# Patient Record
Sex: Male | Born: 1980
Health system: Southern US, Community
[De-identification: ages and names within clinical notes are randomized; demographics above are authoritative.]

## PROBLEM LIST (undated history)

## (undated) DIAGNOSIS — R7303 Prediabetes: Secondary | ICD-10-CM

## (undated) DIAGNOSIS — U071 COVID-19: Secondary | ICD-10-CM

## (undated) DIAGNOSIS — F329 Major depressive disorder, single episode, unspecified: Secondary | ICD-10-CM

## (undated) DIAGNOSIS — Z87442 Personal history of urinary calculi: Secondary | ICD-10-CM

## (undated) DIAGNOSIS — F32A Depression, unspecified: Secondary | ICD-10-CM

## (undated) DIAGNOSIS — G4730 Sleep apnea, unspecified: Secondary | ICD-10-CM

## (undated) HISTORY — DX: Major depressive disorder, single episode, unspecified: F32.9

## (undated) HISTORY — DX: Depression, unspecified: F32.A

## (undated) HISTORY — PX: FOOT SURGERY: SHX648

## (undated) HISTORY — DX: Personal history of urinary calculi: Z87.442

## (undated) HISTORY — PX: OTHER SURGICAL HISTORY: SHX169

## (undated) HISTORY — PX: CHOLECYSTECTOMY: SHX55

---

## 2009-01-07 ENCOUNTER — Ambulatory Visit: Payer: Self-pay | Admitting: Family Medicine

## 2009-01-07 DIAGNOSIS — F329 Major depressive disorder, single episode, unspecified: Secondary | ICD-10-CM | POA: Insufficient documentation

## 2009-01-08 DIAGNOSIS — Z87442 Personal history of urinary calculi: Secondary | ICD-10-CM | POA: Insufficient documentation

## 2009-02-05 ENCOUNTER — Ambulatory Visit: Payer: Self-pay | Admitting: Family Medicine

## 2009-03-10 ENCOUNTER — Ambulatory Visit: Payer: Self-pay | Admitting: Family Medicine

## 2009-03-10 DIAGNOSIS — B369 Superficial mycosis, unspecified: Secondary | ICD-10-CM | POA: Insufficient documentation

## 2009-03-23 ENCOUNTER — Ambulatory Visit: Payer: Self-pay | Admitting: Family Medicine

## 2009-03-25 ENCOUNTER — Ambulatory Visit: Payer: Self-pay | Admitting: Family Medicine

## 2009-03-25 ENCOUNTER — Telehealth: Payer: Self-pay | Admitting: Family Medicine

## 2009-05-25 ENCOUNTER — Ambulatory Visit: Payer: Self-pay | Admitting: Family Medicine

## 2009-06-29 ENCOUNTER — Ambulatory Visit: Payer: Self-pay | Admitting: Family Medicine

## 2009-07-02 ENCOUNTER — Telehealth: Payer: Self-pay | Admitting: Family Medicine

## 2009-07-14 ENCOUNTER — Telehealth: Payer: Self-pay | Admitting: Family Medicine

## 2009-07-30 ENCOUNTER — Ambulatory Visit: Payer: Self-pay | Admitting: Family Medicine

## 2009-07-30 DIAGNOSIS — J111 Influenza due to unidentified influenza virus with other respiratory manifestations: Secondary | ICD-10-CM | POA: Insufficient documentation

## 2011-04-04 ENCOUNTER — Inpatient Hospital Stay (INDEPENDENT_AMBULATORY_CARE_PROVIDER_SITE_OTHER)
Admission: RE | Admit: 2011-04-04 | Discharge: 2011-04-04 | Disposition: A | Discharge status: Home / Self Care | Payer: Managed Care, Other (non HMO) | Source: Ambulatory Visit | Attending: Emergency Medicine | Admitting: Emergency Medicine

## 2011-04-04 ENCOUNTER — Observation Stay (HOSPITAL_COMMUNITY)
Admission: EM | Admit: 2011-04-04 | Discharge: 2011-04-05 | Disposition: A | Discharge status: Home / Self Care | Payer: Managed Care, Other (non HMO) | Source: Ambulatory Visit | Attending: General Surgery | Admitting: General Surgery

## 2011-04-04 DIAGNOSIS — K37 Unspecified appendicitis: Secondary | ICD-10-CM

## 2011-04-04 DIAGNOSIS — I10 Essential (primary) hypertension: Secondary | ICD-10-CM | POA: Insufficient documentation

## 2011-04-04 DIAGNOSIS — K3580 Unspecified acute appendicitis: Principal | ICD-10-CM | POA: Insufficient documentation

## 2011-04-04 DIAGNOSIS — Z01812 Encounter for preprocedural laboratory examination: Secondary | ICD-10-CM | POA: Insufficient documentation

## 2011-04-04 LAB — COMPREHENSIVE METABOLIC PANEL
AST: 12 U/L (ref 0–37)
BUN: 12 mg/dL (ref 6–23)
CO2: 27 mEq/L (ref 19–32)
Calcium: 9.6 mg/dL (ref 8.4–10.5)
Chloride: 101 mEq/L (ref 96–112)
Creatinine, Ser: 0.85 mg/dL (ref 0.50–1.35)
GFR calc Af Amer: >60 mL/min (ref >60)
GFR calc non Af Amer: >60 mL/min (ref >60)
Glucose, Bld: 99 mg/dL (ref 70–99)
Total Bilirubin: 0.5 mg/dL (ref 0.3–1.2)

## 2011-04-04 LAB — DIFFERENTIAL
Eosinophils Absolute: 0.1 10*3/uL (ref 0.0–0.7)
Lymphocytes Relative: 23 % (ref 12–46)
Lymphs Abs: 2.4 10*3/uL (ref 0.7–4.0)
Monocytes Relative: 11 % (ref 3–12)
Neutrophils Relative %: 65 % (ref 43–77)

## 2011-04-04 LAB — CBC
HCT: 41.5 % (ref 39.0–52.0)
MCH: 30.6 pg (ref 26.0–34.0)
MCV: 90.8 fL (ref 78.0–100.0)
Platelets: 272 10*3/uL (ref 150–400)
RBC: 4.57 MIL/uL (ref 4.22–5.81)
WBC: 10.3 10*3/uL (ref 4.0–10.5)

## 2011-04-04 LAB — POCT URINALYSIS DIP (DEVICE)
Bilirubin Urine: NEGATIVE
Ketones, ur: NEGATIVE mg/dL
Protein, ur: NEGATIVE mg/dL
Specific Gravity, Urine: 1.025 (ref 1.005–1.030)
pH: 7 (ref 5.0–8.0)

## 2011-04-05 ENCOUNTER — Other Ambulatory Visit (INDEPENDENT_AMBULATORY_CARE_PROVIDER_SITE_OTHER): Payer: Self-pay | Admitting: Surgery

## 2011-04-05 ENCOUNTER — Emergency Department (HOSPITAL_COMMUNITY): Payer: Managed Care, Other (non HMO)

## 2011-04-05 DIAGNOSIS — K3580 Unspecified acute appendicitis: Secondary | ICD-10-CM

## 2011-04-05 DIAGNOSIS — K3532 Acute appendicitis with perforation and localized peritonitis, without abscess: Secondary | ICD-10-CM | POA: Insufficient documentation

## 2011-04-05 MED ORDER — IOHEXOL 300 MG/ML  SOLN
100.0000 mL | Freq: Once | INTRAMUSCULAR | Status: AC | PRN
  Administered 2011-04-05: 100 mL via INTRAVENOUS

## 2011-04-10 NOTE — Op Note (Signed)
  NAME:  AMRON, GUERRETTE NO.:  [id number]  MEDICAL RECORD NO.:  [id number]  LOCATION:  [facility]                         FACILITY:  [facility]  PHYSICIAN:  Abigail Miyamoto, M.D. DATE OF BIRTH:  [birth year]  DATE OF PROCEDURE:  04/04/2011 DATE OF DISCHARGE:                              OPERATIVE REPORT   PREOPERATIVE DIAGNOSIS:  Acute appendicitis.  POSTOPERATIVE DIAGNOSIS:  Acute appendicitis.  PROCEDURE:  Laparoscopic appendectomy.  SURGEON:  Abigail Miyamoto, MD  ANESTHESIA:  General and 0.5% Marcaine.  ESTIMATED BLOOD LOSS:  Minimal.  FINDINGS:  The patient is found to have acute nonperforated appendicitis.  PROCEDURE IN DETAIL:  The patient was brought to the operating room, identified as Mabeline Caras.  He was placed spine on the operating table and general anesthesia was induced.  His abdomen was then prepped and draped in usual sterile fashion.  Using #15 blade, a small vertical incision was made below the umbilicus.  This was carried down the fascia which then opened with scalpel.  Hemostat was then used to pass into peritoneal cavity under direct vision.  A 0 Vicryl pursestring suture was then placed around the fascial opening.  The Hasson port was placed through the opening and insufflation of the abdomen was begun.  A 5-mm port was then placed in the patient's right upper quadrant and another in left upper quadrant, both under direct vision.  The appendix was then identified and found to be distended and acutely inflamed.  I took down the mesoappendix with the Harmonic scalpel.  This allowed me to identify the base of the appendix.  I then transected the base of the appendix with the endoscopic GIA stapler.  Once the appendix was transected, it was placed in an Endosac and removed through the incision at the umbilicus.  I again evaluated the mesoappendix and appendiceal stump, and hemostasis appeared to be achieved.  I then thoroughly irrigated  the abdomen with 1 liter normal saline.  There was no evidence of perforation of the appendix.  At this point, again hemostasis appeared to be achieved.  All ports were then removed under direct vision and the abdomen was deflated.  The 0 Vicryl at the umbilicus was tied in place closing the fascial defect.  All incisions were then anesthetized with Marcaine and closed with 4-0 Monocryl subcuticular sutures.  Steri-Strips and Band-Aids were then applied.  The patient tolerated the procedure well.  All counts were correct at the end of the procedure.  The patient was then extubated in the operating room and taken in stable condition to recovery room.     Abigail Miyamoto, M.D.     DB/MEDQ  D:  04/05/2011  T:  04/05/2011  Job:  [id number]  Electronically Signed by Abigail Miyamoto M.D. on 04/10/2011 02:04:44 PM

## 2011-04-10 NOTE — H&P (Signed)
NAME:  Jonathan Harper, Jonathan Harper NO.:  [id number]  MEDICAL RECORD NO.:  [id number]  LOCATION:  [facility]                         FACILITY:  [facility]  PHYSICIAN:  Abigail Miyamoto, M.D. DATE OF BIRTH:  [birth year]  DATE OF ADMISSION:  04/04/2011 DATE OF DISCHARGE:                             HISTORY & PHYSICAL   CHIEF COMPLAINT:  Right lower quadrant abdominal pain.  HISTORY:  This is a 30 year old gentleman who presents with a 24-hour history of right lower quadrant abdominal pain.  He reports it started in the right lower quadrant.  He describes as sharp and moderate in nature.  He does not refer anywhere else.  It is continuous.  He has had some slight nausea with this, but no vomiting.  He is otherwise without complaints.  PAST MEDICAL HISTORY:  Borderline hypertension.  PAST SURGICAL HISTORY:  Removal of bone spur from his foot.  MEDICATIONS:  None.  ALLERGIES:  No known drug allergies.  FAMILY HISTORY:  Positive for hypertension.  SOCIAL HISTORY:  He does drink alcohol.  He denies smoking.  REVIEW OF SYSTEMS:  GENERAL:  Negative fever or chills.  PULMONARY: Negative for cough, shortness of breath, or difficulty breathing. CARDIAC:  Negative for chest pain or irregular heartbeat.  ABDOMEN: Listed as above.  There has been no emesis.  Bowel movements are normal. URINARY:  Negative for dysuria or hematuria.  The rest review of systems skin, eyes, ears, nose, throat, musculoskeletal, neurologic, psychiatric, endocrine are normal.  PHYSICAL EXAMINATION:  GENERAL:  This is a large gentleman in no acute distress. VITAL SIGNS:  Temperature 98.6, pulse 70, respiratory rate 18, blood pressure 125/78. EYES:  Anicteric.  Pupils reactive bilaterally. ENT:  External ears, nose are normal.  Hearing is normal.  Oropharynx clear. NECK: Supple.  Trachea is midline.  There is no thyromegaly. LUNGS:  Clear to auscultation bilaterally with normal  respiratory effort. CARDIOVASCULAR:  Regular rate and rhythm.  There are no murmurs.  There is no peripheral edema. ABDOMEN:  Soft.  There is tenderness with guarding in the right lower quadrant.  There are no hernias.  There are no masses. EXTREMITIES:  Warm and well-perfused.  No edema, clubbing, cyanosis. MUSCULOSKELETAL:  Normal throughout with normal strength and motor function in all four extremities. SKIN:  No rash, no jaundice. NEUROLOGIC:  Shows him to be awake, alert, and oriented.  Motor and sensory function are grossly intact in all four extremities. PSYCHIATRIC:  Shows him to have normal judgment and affect.  Again, he is awake, alert, and oriented x3.  DATA REVIEW:  The patient has a white blood count of 10.3, hemoglobin of 14.0, platelets of 278, creatinine 0.85, potassium 3.6.  The patient's CAT scan of abdomen and pelvis shows him to have dilated appendix with periappendiceal stranding.  IMPRESSION:  This is a 30 year old gentleman with acute appendicitis.  PLAN:  At this point, we will proceed to the operating room for an appendectomy.  I have discussed the laparoscopic technique with the patient and his family.  I discussed the risk of surgery, which include but not limited to bleeding, infection, injury to surrounding structures including ureters, appendiceal stump leak, abscess formation, etc.  They understand the risk and wish to proceed.  Surgery will thus be scheduled urgently.     Abigail Miyamoto, M.D.     DB/MEDQ  D:  04/05/2011  T:  04/05/2011  Job:  [id number]  Electronically Signed by Abigail Miyamoto M.D. on 04/10/2011 02:04:41 PM

## 2011-04-11 ENCOUNTER — Encounter (INDEPENDENT_AMBULATORY_CARE_PROVIDER_SITE_OTHER): Payer: Self-pay

## 2011-04-26 ENCOUNTER — Encounter (INDEPENDENT_AMBULATORY_CARE_PROVIDER_SITE_OTHER): Payer: Self-pay

## 2011-04-26 ENCOUNTER — Ambulatory Visit (INDEPENDENT_AMBULATORY_CARE_PROVIDER_SITE_OTHER): Payer: Managed Care, Other (non HMO) | Admitting: Radiology

## 2011-04-26 DIAGNOSIS — K352 Acute appendicitis with generalized peritonitis, without abscess: Secondary | ICD-10-CM

## 2011-04-26 DIAGNOSIS — K3532 Acute appendicitis with perforation and localized peritonitis, without abscess: Secondary | ICD-10-CM

## 2011-04-26 NOTE — Progress Notes (Signed)
History of Present Illness: Jonathan Harper is a  30 y.o. male who presents today status post lap appy.  Pathology reveals appendicitis with focal perforation, no malignancy.  The patient is tolerating a regular diet, having normal bowel movements, has good pain control.  He  is back to most normal activities. He has returned to work with some restrictions already.  Physical Exam: Abd: soft, nontender, active bowel sounds, nondistended.  All incisions are well healed.  Impression: 1.  Acute appendicitis, s/p lap appy  Plan: He is able to return to normal activities. He may follow up on a prn basis.

## 2011-07-18 ENCOUNTER — Ambulatory Visit (INDEPENDENT_AMBULATORY_CARE_PROVIDER_SITE_OTHER): Payer: Managed Care, Other (non HMO) | Admitting: Family Medicine

## 2011-07-18 ENCOUNTER — Encounter: Payer: Self-pay | Admitting: Family Medicine

## 2011-07-18 VITALS — BP 120/84 | HR 74 | Temp 97.4°F | Ht 73.0 in | Wt 267.8 lb

## 2011-07-18 DIAGNOSIS — S0501XA Injury of conjunctiva and corneal abrasion without foreign body, right eye, initial encounter: Secondary | ICD-10-CM

## 2011-07-18 DIAGNOSIS — S058X9A Other injuries of unspecified eye and orbit, initial encounter: Secondary | ICD-10-CM

## 2011-07-18 MED ORDER — ERYTHROMYCIN 5 MG/GM OP OINT: TOPICAL_OINTMENT | Freq: Four times a day (QID) | OPHTHALMIC | Status: AC

## 2011-07-18 NOTE — Progress Notes (Signed)
  Subjective:    Patient ID: Jonathan Harper, male    DOB: 09-Jun-1981, 30 y.o.   MRN: [id number]  HPI  Pleasant healthy gentleman, 30 years old, who presents with a two-day history of right back pain and redness. His sclera is white, and reddish in appearance, and he also had some heavy matting this morning. The only history of potential trauma is over the weekend, building a fire and chopping some wood. He does not recall any specific incidents of trauma to his eye.  The PMH, PSH, Social History, Family History, Medications, and allergies have been reviewed in [facility], and have been updated if relevant.   Review of Systems ROS: GEN: Acute illness details above GI: Tolerating PO intake GU: maintaining adequate hydration and urination Pulm: No SOB Interactive and getting along well at home.  Otherwise, ROS is as per the HPI. k    Objective:   Physical Exam   Physical Exam  Blood pressure 120/84, pulse 74, temperature 97.4 F (36.3 C), temperature source Oral, height 6\' 1"  (1.854 m), weight 267 lb 12.8 oz (121.473 kg), SpO2 98.00%.  GEN: WDWN, NAD, Non-toxic, A & O x 3 HEENT: Atraumatic, Normocephalic. Neck supple. No masses, No LAD. EOMI. examined using high kit and fluorescein dye. The right eye, on the right upper lateral aspect of the cornea into the sclera, there appeared to be a defect that had some uptake of fluorescein dye. Ears and Nose: No external deformity. EXTR: No c/c/e NEURO Normal gait.  PSYCH: Normally interactive. Conversant. Not depressed or anxious appearing.  Calm demeanor.         Assessment & Plan:   1. Corneal abrasion, right  erythromycin ophthalmic ointment    To me this has the appearance on fluoroscopic evaluation of a small corneal abrasion. I will keep him using erythromycin ointment 4 times daily, and recheck his eye on Thursday

## 2011-07-18 NOTE — Patient Instructions (Signed)
Recheck with Dr. Patsy Lager on Thursday morning

## 2011-07-21 ENCOUNTER — Encounter: Payer: Self-pay | Admitting: Family Medicine

## 2011-07-21 ENCOUNTER — Ambulatory Visit (INDEPENDENT_AMBULATORY_CARE_PROVIDER_SITE_OTHER): Payer: Managed Care, Other (non HMO) | Admitting: Family Medicine

## 2011-07-21 VITALS — BP 120/78 | HR 66 | Temp 98.6°F | Ht 73.0 in | Wt 268.0 lb

## 2011-07-21 DIAGNOSIS — S058X9A Other injuries of unspecified eye and orbit, initial encounter: Secondary | ICD-10-CM

## 2011-07-21 DIAGNOSIS — S0501XA Injury of conjunctiva and corneal abrasion without foreign body, right eye, initial encounter: Secondary | ICD-10-CM

## 2011-07-21 NOTE — Progress Notes (Signed)
  Subjective:    Patient ID: Jonathan Harper, male    DOB: [birth year], 30 y.o.   MRN: [id number]  HPI  F/u corneal abrasion on the R. Pain decreasing. Eye improving. Sight improving. Using erythro ointment   The PMH, PSH, Social History, Family History, Medications, and allergies have been reviewed in [facility], and have been updated if relevant.   Review of Systems No fever or chills    Objective:   Physical Exam   Physical Exam  Blood pressure 120/78, pulse 66, temperature 98.6 F (37 C), temperature source Oral, height 6\' 1"  (1.854 m), weight 268 lb (121.564 kg), SpO2 98.00%.  GEN: WDWN, NAD, Non-toxic, A & O x 3 HEENT: Atraumatic, Normocephalic. Neck supple. No masses, No LAD. PERRLA. EOMI Ears and Nose: No external deformity. EXTR: No c/c/e NEURO Normal gait.  PSYCH: Normally interactive. Conversant. Not depressed or anxious appearing.  Calm demeanor.        Assessment & Plan:   Corneal abrasion, improving, cont ABX ointment

## 2011-11-21 ENCOUNTER — Ambulatory Visit (INDEPENDENT_AMBULATORY_CARE_PROVIDER_SITE_OTHER)
Admission: RE | Admit: 2011-11-21 | Discharge: 2011-11-21 | Disposition: A | Discharge status: Home / Self Care | Payer: Managed Care, Other (non HMO) | Source: Ambulatory Visit | Attending: Family Medicine | Admitting: Family Medicine

## 2011-11-21 ENCOUNTER — Encounter: Payer: Self-pay | Admitting: Family Medicine

## 2011-11-21 ENCOUNTER — Ambulatory Visit (INDEPENDENT_AMBULATORY_CARE_PROVIDER_SITE_OTHER): Payer: Managed Care, Other (non HMO) | Admitting: Family Medicine

## 2011-11-21 VITALS — BP 120/82 | HR 79 | Temp 98.6°F | Ht 73.0 in | Wt 271.0 lb

## 2011-11-21 DIAGNOSIS — M25569 Pain in unspecified knee: Secondary | ICD-10-CM

## 2011-11-21 DIAGNOSIS — M2391 Unspecified internal derangement of right knee: Secondary | ICD-10-CM

## 2011-11-21 DIAGNOSIS — M25561 Pain in right knee: Secondary | ICD-10-CM

## 2011-11-21 DIAGNOSIS — Z3009 Encounter for other general counseling and advice on contraception: Secondary | ICD-10-CM

## 2011-11-21 DIAGNOSIS — M2390 Unspecified internal derangement of unspecified knee: Secondary | ICD-10-CM

## 2011-11-21 MED ORDER — DICLOFENAC SODIUM 75 MG PO TBEC: 75.0000 mg | DELAYED_RELEASE_TABLET | Freq: Two times a day (BID) | ORAL | Status: AC

## 2011-11-21 NOTE — Progress Notes (Signed)
  Patient Name: Jonathan Harper Date of Birth: [birth year] Age: 31 y.o. Medical Record Number: [id number] Gender: male Date of Encounter: 11/21/2011  History of Present Illness:  Jonathan Harper is a 31 y.o. very pleasant male patient who presents with the following:  R knee, no fall, no bumping it or anything in any way. Hurts when squatting -- diffusely anterior. Has had some occ soreness in knees.   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  Patient presents with 30 day h/o R sided knee pain after no acute injury. No audible pop was heard. The patient has not had an effusion. No symptomatic giving-way. No mechanical clicking. Joint has not locked up. Patient has been able to walk but is limping. The patient does have pain going up and down stairs or rising from a seated position. Pain with deep flexion.  Pain location: anterior and interior Current physical activity: active job Prior Knee Surgery: none Current pain meds: none    Past Medical History, Surgical History, Social History, Family History, Problem List, Medications, and Allergies have been reviewed and updated if relevant.  Review of Systems:  GEN: No fevers, chills. Nontoxic. Primarily MSK c/o today. MSK: Detailed in the HPI GI: tolerating PO intake without difficulty Neuro: No numbness, parasthesias, or tingling associated. Otherwise the pertinent positives of the ROS are noted above.    Physical Examination: Filed Vitals:   11/21/11 0816  BP: 120/82  Pulse: 79  Temp: 98.6 F (37 C)  TempSrc: Oral  Height: 6\' 1"  (1.854 m)  Weight: 271 lb (122.925 kg)  SpO2: 98%    Body mass index is 35.75 kg/(m^2).   GEN: WDWN, NAD, Non-toxic, Alert & Oriented x 3 HEENT: Atraumatic, Normocephalic.  Ears and Nose: No external deformity. EXTR: No clubbing/cyanosis/edema NEURO: Normal gait.  PSYCH: Normally interactive. Conversant. Not depressed or anxious appearing.  Calm demeanor.   Knee:  r Gait: Normal heel toe pattern ROM: loss of  3 deg ext and flexion to 125 Effusion: neg Echymosis or edema: none Patellar tendon NT Painful PLICA: neg Patellar grind: negative Medial and lateral patellar facet loading: negative medial and lateral joint lines:NT Mcmurray's pain Flexion-pinch pos Flexion pinch, pos Varus and valgus stress: stable Lachman: neg Ant and Post drawer: neg Hip abduction, IR, ER: WNL Hip flexion str: 5/5 Hip abd: 5/5 Quad: 5/5 VMO atrophy:No Hamstring concentric and eccentric: 5/5   Assessment and Plan: 1. Internal derangement of right knee    2. Right knee pain  DG Knee Bilateral Standing AP, DG Knee 1-2 Views Right  3. Birth control counseling  Ambulatory referral to Urology     Meds ordered this encounter  Medications  . diclofenac (VOLTAREN) 75 MG EC tablet    Sig: Take 1 tablet (75 mg total) by mouth 2 (two) times daily.    Dispense:  60 tablet    Refill:  3   Ref for vasectomy  Synovitis vs potential meniscal injury Oral nsaids If not improving in 3-4 weeks, will f/u with me

## 2011-12-13 ENCOUNTER — Encounter: Payer: Self-pay | Admitting: Family Medicine

## 2011-12-13 ENCOUNTER — Ambulatory Visit (INDEPENDENT_AMBULATORY_CARE_PROVIDER_SITE_OTHER): Payer: Managed Care, Other (non HMO) | Admitting: Family Medicine

## 2011-12-13 VITALS — BP 138/90 | HR 72 | Temp 97.9°F | Wt 271.0 lb

## 2011-12-13 DIAGNOSIS — J069 Acute upper respiratory infection, unspecified: Secondary | ICD-10-CM

## 2011-12-13 NOTE — Patient Instructions (Signed)
This is likely a virus.  Drink lots of fluids.  Treat sympotmatically with Mucinex, nasal saline irrigation, and Tylenol/Ibuprofen.  You can use warm compresses.  Cough suppressant at night. Call if not improving as expected in 5-7 days.

## 2011-12-13 NOTE — Progress Notes (Signed)
SUBJECTIVE:  Jonathan Harper is a 31 y.o. male who complains of coryza, congestion, sneezing and sore throat for 3 days. He denies a history of anorexia, chest pain and chills and denies a history of asthma. Patient denies smoke cigarettes.   Patient Active Problem List  Diagnoses  . DEPRESSION  . NEPHROLITHIASIS, HX OF  . URI (upper respiratory infection)   Past Medical History  Diagnosis Date  . Depression   . History of nephrolithiasis    Past Surgical History  Procedure Date  . Cholecystectomy   . Foot surgery   . Heel spur removal    History  Substance Use Topics  . Smoking status: Current Some Day Smoker    Types: Cigars  . Smokeless tobacco: Not on file   Comment: May smoke a cigar every month or two  . Alcohol Use: Yes   Family History  Problem Relation Age of Onset  . Hypertension    . Cancer    . Arthritis Mother   . Arthritis Father    No Known Allergies Current Outpatient Prescriptions on File Prior to Visit  Medication Sig Dispense Refill  . diclofenac (VOLTAREN) 75 MG EC tablet Take 1 tablet (75 mg total) by mouth 2 (two) times daily.  60 tablet  3  . famotidine (PEPCID) 10 MG tablet Take 10 mg by mouth every morning.         The PMH, PSH, Social History, Family History, Medications, and allergies have been reviewed in [facility] and have been updated if relevant.  OBJECTIVE: BP 138/90  Pulse 72  Temp(Src) 97.9 F (36.6 C) (Oral)  Wt 271 lb (122.925 kg)  He appears well, vital signs are as noted. Ears normal.  Throat and pharynx normal.  Neck supple. No adenopathy in the neck. Nose is congested. Sinuses non tender. The chest is clear, without wheezes or rales.  ASSESSMENT:  viral upper respiratory illness  PLAN: Symptomatic therapy suggested: push fluids, rest and return office visit prn if symptoms persist or worsen. Lack of antibiotic effectiveness discussed with him. Call or return to clinic prn if these symptoms worsen or fail to improve as  anticipated.

## 2011-12-28 ENCOUNTER — Telehealth: Payer: Self-pay | Admitting: *Deleted

## 2011-12-28 NOTE — Telephone Encounter (Signed)
Pt brought in FMLA forms for being out of work with URI.  I filled this out the best that I could, please review, form is on your desk.  He says he was out of work 12/12/11-12/14/11.

## 2011-12-29 NOTE — Telephone Encounter (Signed)
Thank you. Completed and in my box. 

## 2011-12-29 NOTE — Telephone Encounter (Signed)
Forms faxed to [phone], pt's employer.

## 2013-06-17 ENCOUNTER — Ambulatory Visit (INDEPENDENT_AMBULATORY_CARE_PROVIDER_SITE_OTHER): Payer: Managed Care, Other (non HMO) | Admitting: Family Medicine

## 2013-06-17 ENCOUNTER — Encounter: Payer: Self-pay | Admitting: Family Medicine

## 2013-06-17 VITALS — BP 124/86 | HR 72 | Temp 98.3°F | Wt 267.5 lb

## 2013-06-17 DIAGNOSIS — B353 Tinea pedis: Secondary | ICD-10-CM

## 2013-06-17 MED ORDER — CEPHALEXIN 500 MG PO CAPS: 500.0000 mg | ORAL_CAPSULE | Freq: Four times a day (QID) | ORAL | Status: DC

## 2013-06-17 NOTE — Assessment & Plan Note (Signed)
He may have had a transient infection with skin disruption from tinea/irritation.  Hold abx for now, treat likely fungal infection.  D/w pt about keeping foot dry.  F/u prn.  He agrees.

## 2013-06-17 NOTE — Patient Instructions (Addendum)
Keep your foot elevated today.  Keep your feet dry and change socks often. Use the antibiotics if you get redness.  Use an OTC athlete's foot cream.   Take care.

## 2013-06-17 NOTE — Progress Notes (Signed)
L foot concern, started in the last few days. He was worried about an infection.  Tender at the 1st and 2nd web space.  Less tender today.  Some puffiness and sore on dorsal distal midfoot.  No h/o gout.  H/o dry cracking skin in the area.  No FCNAVD.  No trauma.  Less sore today.  Steel toe at work.  Heavy foot sweat.    Meds, vitals, and allergies reviewed.   ROS: See HPI.  Otherwise, noncontributory.  nad L foot with dry flaking skin in the web spaces Normal DP pulse, no erythema.  Mildly puffy on the dorsum of the distal midfoot No fluctuant mass or red cellulitis.

## 2013-09-16 ENCOUNTER — Ambulatory Visit (INDEPENDENT_AMBULATORY_CARE_PROVIDER_SITE_OTHER): Payer: Managed Care, Other (non HMO) | Admitting: Family Medicine

## 2013-09-16 ENCOUNTER — Encounter: Payer: Self-pay | Admitting: Family Medicine

## 2013-09-16 VITALS — BP 144/94 | HR 93 | Temp 98.2°F | Wt 279.0 lb

## 2013-09-16 DIAGNOSIS — M5432 Sciatica, left side: Secondary | ICD-10-CM

## 2013-09-16 DIAGNOSIS — M5430 Sciatica, unspecified side: Secondary | ICD-10-CM | POA: Insufficient documentation

## 2013-09-16 MED ORDER — HYDROCODONE-ACETAMINOPHEN 5-325 MG PO TABS: 1.0000 | ORAL_TABLET | Freq: Four times a day (QID) | ORAL | Status: DC | PRN

## 2013-09-16 MED ORDER — CYCLOBENZAPRINE HCL 10 MG PO TABS: 10.0000 mg | ORAL_TABLET | Freq: Three times a day (TID) | ORAL | Status: DC | PRN

## 2013-09-16 MED ORDER — IBUPROFEN 200 MG PO CAPS: 600.0000 mg | ORAL_CAPSULE | Freq: Three times a day (TID) | ORAL | Status: DC | PRN

## 2013-09-16 NOTE — Progress Notes (Signed)
Pre-visit discussion using our clinic review tool. No additional management support is needed unless otherwise documented below in the visit note.  Friday at work he was shoveling. Felt tighter in his back as the day went on.  Trouble getting out of bed on Saturday.  L lower back pain, tight.  Pain with leaning over, certain movements. No R sided pain.  No rash.  L radicular pain, posterior, "like an ice pick."  No FCNAVD.  No dysuria.  Sensation still wnl for the BLE.  He has "pulled" his back prev but this didn't resolve as quickly as previous.    Meds, vitals, and allergies reviewed.   ROS: See HPI.  Otherwise, noncontributory.  nad ncat rrr ctab No midline back pain L lower back ttp, at the belt line, R lower back not ttp No CVA pain Pain with flex and ext of back, pain with twisting Distally nv intact with S/S wnl for the BLE  No rash Pain radiating down the posterior L leg

## 2013-09-16 NOTE — Assessment & Plan Note (Signed)
Anatomy d/w pt. Would use ibuprofen with GI caution, vicodin and flexeril with sedation caution.  Handout given re: home exercises.  We can refer to PT if needed.  No imaging needed. Call back prn.  He agrees.

## 2013-09-16 NOTE — Patient Instructions (Signed)
Ibuprofen with food, sedation caution on flexeril and hydrocodone.  Heating pad and ice as needed.  Use the stretches.  Take care.  We can refer you to PT if needed.

## 2013-10-21 ENCOUNTER — Encounter: Payer: Self-pay | Admitting: Family Medicine

## 2013-10-21 ENCOUNTER — Ambulatory Visit (INDEPENDENT_AMBULATORY_CARE_PROVIDER_SITE_OTHER): Payer: Managed Care, Other (non HMO) | Admitting: Family Medicine

## 2013-10-21 VITALS — BP 128/88 | HR 75 | Temp 98.1°F | Wt 277.8 lb

## 2013-10-21 DIAGNOSIS — J111 Influenza due to unidentified influenza virus with other respiratory manifestations: Secondary | ICD-10-CM

## 2013-10-21 NOTE — Progress Notes (Signed)
   Patient Name: Jonathan Harper Date of Birth: 1980-12-09 Medical Record Number: [id number] Gender: male  History of Present Illness:  Jonathan CarasClay Zhao presents with runny nose, sneezing, cough, sore throat, malaise, myalgias, arthralgia, chills, and fever. Since last Wednesday, had a bad pain in his throat and had some off and on fever. Some wheezy cough with a fever. Did have some achy and full body ache. Lost voice for a couple of days. Fever to about 101.   Taking nyquil and dayquil.   +recent exposure to others with similar symptoms.   The patent denies sore throat as the primary complaint. Denies sthortness of breath/wheezing, otalgia, facial pain, abdominal pain, changes in bowel or bladder.  Generally feels terrible  Tmax: 101  PMH, PHS, Allergies, Problem List, Medications, Family History, and Social History have all been reviewed.  Review of Systems: as above, eating and drinking - tolerating PO. Urinating normally. No excessive vomitting or diarrhea. O/w as above.  Physical Exam:  Filed Vitals:   10/21/13 1548  BP: 128/88  Pulse: 75  Temp: 98.1 F (36.7 C)  TempSrc: Oral  Weight: 277 lb 12 oz (125.987 kg)  SpO2: 96%    Gen: WDWN, NAD; A & O x3, cooperative. Pleasant.Globally Non-toxic HEENT: Normocephalic and atraumatic. Throat clear, w/o exudate, R TM clear, L TM - good landmarks, No fluid present. rhinnorhea. No frontal or maxillary sinus T. MMM NECK: Anterior cervical  LAD is absent CV: RRR, No M/G/R, cap refill <2 sec PULM: Breathing comfortably in no respiratory distress. no wheezing, crackles, rhonchi ABD: S,NT,ND,+BS. No HSM. No rebound. EXT: No c/c/e PSYCH: Friendly, good eye contact MSK: Nml gait   Assessment and Plan: 1. Influenza: The patient's clinical exam and history is consistent with a diagnosis of influenza. Resolving, cont supportive care. OK to return to work. AF now 48 hours  Supportive care. Fluids. Cough medicines as needed    Anti-pyretics.  Infection control emphasized, including OOW or school until AF 24 hours.  No orders of the defined types were placed in this encounter.    Patient Instructions: INFLUENZA Viral illness: High fever, headache, bad cough, body aches, sore throat, sometimes nausea, vomitting, diarrhea.  Usually quick onset  TREATMENT 1. Decongestant: Sudafed (NOT IF HIGH BLOOD PRESSURE) 2. Nose Sprays: Saline nasal spray, Can use Afrin or Neosynephrine, but no more than 3 days 3. Cough Suppressants: DM portion of cough med, or Strong prescription 4. Expectorant: Liquify Secretions (Guaifenesin) 5. Example: Mucinex (expectorant) or Mucinex-D (expectorant and decongestant) 6. Take all prescribed meds 7. Anti-virals may be used if caught early or in high risk people. (Do NOT if pregnant, breast feeding, seizure disorder) 8. Rest helpful, but move some during day 9. Breathe moist air: Humidifier, Vaporizer, or steam from shower 10. No work or school until no fever for 24 hrs WITH NO Tylenol or Ibuprofen. 11. Pneumonia on top of Flu is possible, if you are doing poorly particularly if a smoker or have COPD, please let us know. 12. Wash hands and cover mouth with cough   Signed,  Takoda Janowiak T. Mahdi Frye, MD, CAQ Sports Medicine  [facility] [address] [place] [address] Phone: [phone] Fax: [phone]

## 2013-10-21 NOTE — Progress Notes (Signed)
Pre-visit discussion using our clinic review tool. No additional management support is needed unless otherwise documented below in the visit note.  

## 2013-10-22 ENCOUNTER — Telehealth: Payer: Self-pay | Admitting: Family Medicine

## 2013-10-22 NOTE — Telephone Encounter (Signed)
Relevant patient education assigned to patient using Emmi. ° °

## 2013-11-01 ENCOUNTER — Encounter: Payer: Self-pay | Admitting: Family Medicine

## 2013-11-01 ENCOUNTER — Ambulatory Visit (INDEPENDENT_AMBULATORY_CARE_PROVIDER_SITE_OTHER): Payer: Managed Care, Other (non HMO) | Admitting: Family Medicine

## 2013-11-01 VITALS — BP 120/90 | HR 84 | Temp 98.7°F | Ht 73.0 in | Wt 282.8 lb

## 2013-11-01 DIAGNOSIS — J209 Acute bronchitis, unspecified: Secondary | ICD-10-CM

## 2013-11-01 DIAGNOSIS — B9689 Other specified bacterial agents as the cause of diseases classified elsewhere: Secondary | ICD-10-CM

## 2013-11-01 DIAGNOSIS — A499 Bacterial infection, unspecified: Secondary | ICD-10-CM

## 2013-11-01 DIAGNOSIS — J208 Acute bronchitis due to other specified organisms: Principal | ICD-10-CM

## 2013-11-01 MED ORDER — ALBUTEROL SULFATE HFA 108 (90 BASE) MCG/ACT IN AERS: 2.0000 | INHALATION_SPRAY | Freq: Four times a day (QID) | RESPIRATORY_TRACT | Status: DC | PRN

## 2013-11-01 MED ORDER — AZITHROMYCIN 250 MG PO TABS: ORAL_TABLET | ORAL | Status: DC

## 2013-11-01 NOTE — Progress Notes (Signed)
   Subjective:    Patient ID: Jonathan Harper, male    DOB: [birth year], 33 y.o.   MRN: [id number]  Cough This is a new problem. The current episode started 1 to 4 weeks ago (Started with virla illness in 2/9.Marland Kitchen. seen by Dr. Patsy Lageropland, got a little better, than symtpoms returned in lastr 3 days.). The problem has been gradually worsening. The cough is productive of purulent sputum (dark brown). Associated symptoms include chills, ear congestion, headaches, nasal congestion, rhinorrhea, shortness of breath and wheezing. Pertinent negatives include no ear pain, fever, myalgias, postnasal drip or rash. Associated symptoms comments: SOB with coughing fits.. Risk factors for lung disease include smoking/tobacco exposure. Treatments tried: nyquil and dayquil, cough drops. The treatment provided mild relief. There is no history of asthma, bronchiectasis, COPD, emphysema, environmental allergies or pneumonia.      Review of Systems  Constitutional: Positive for chills. Negative for fever.  HENT: Positive for rhinorrhea. Negative for ear pain and postnasal drip.   Respiratory: Positive for cough, shortness of breath and wheezing.   Musculoskeletal: Negative for myalgias.  Skin: Negative for rash.  Allergic/Immunologic: Negative for environmental allergies.  Neurological: Positive for headaches.       Objective:   Physical Exam  Constitutional: Vital signs are normal. He appears well-developed and well-nourished.  Non-toxic appearance. He does not appear ill. No distress.  HENT:  Head: Normocephalic and atraumatic.  Right Ear: Hearing, tympanic membrane, external ear and ear canal normal. No tenderness. No foreign bodies. Tympanic membrane is not retracted and not bulging.  Left Ear: Hearing, tympanic membrane, external ear and ear canal normal. No tenderness. No foreign bodies. Tympanic membrane is not retracted and not bulging.  Nose: Nose normal. No mucosal edema or rhinorrhea. Right sinus exhibits no  maxillary sinus tenderness and no frontal sinus tenderness. Left sinus exhibits no maxillary sinus tenderness and no frontal sinus tenderness.  Mouth/Throat: Uvula is midline, oropharynx is clear and moist and mucous membranes are normal. Normal dentition. No dental caries. No oropharyngeal exudate or tonsillar abscesses.  Eyes: Conjunctivae, EOM and lids are normal. Pupils are equal, round, and reactive to light. Lids are everted and swept, no foreign bodies found.  Neck: Trachea normal, normal range of motion and phonation normal. Neck supple. Carotid bruit is not present. No mass and no thyromegaly present.  Cardiovascular: Normal rate, regular rhythm, S1 normal, S2 normal, normal heart sounds, intact distal pulses and normal pulses.  Exam reveals no gallop.   No murmur heard. Pulmonary/Chest: Effort normal. No respiratory distress. He has no decreased breath sounds. He has wheezes in the right upper field, the right middle field, the right lower field, the left upper field, the left middle field and the left lower field. He has no rhonchi. He has no rales.  Abdominal: Soft. Normal appearance and bowel sounds are normal. There is no hepatosplenomegaly. There is no tenderness. There is no rebound, no guarding and no CVA tenderness. No hernia.  Neurological: He is alert. He has normal reflexes.  Skin: Skin is warm, dry and intact. No rash noted.  Psychiatric: He has a normal mood and affect. His speech is normal and behavior is normal. Judgment normal.          Assessment & Plan:

## 2013-11-01 NOTE — Patient Instructions (Signed)
Mucinex DM twice daily.. Or nyquil to dayquil. Start and complete the antibiotics. Albuterol for shortness of breath or wheeze, cough fits. Call if fever on antibiotics or SOB worsening, or not any better in 48-72 hours.

## 2013-11-01 NOTE — Progress Notes (Signed)
Pre visit review using our clinic review tool, if applicable. No additional management support is needed unless otherwise documented below in the visit note. 

## 2013-11-01 NOTE — Assessment & Plan Note (Signed)
Bacterial superinfection on top of viral infection. Treat with antibitoics. Albuterol for wheeze, if not improving consider CXR and pred taper.

## 2013-11-04 ENCOUNTER — Telehealth: Payer: Self-pay | Admitting: Family Medicine

## 2013-11-04 NOTE — Telephone Encounter (Signed)
Relevant patient education assigned to patient using Emmi. ° °

## 2014-11-12 ENCOUNTER — Ambulatory Visit (INDEPENDENT_AMBULATORY_CARE_PROVIDER_SITE_OTHER): Payer: BLUE CROSS/BLUE SHIELD | Admitting: Family Medicine

## 2014-11-12 ENCOUNTER — Encounter: Payer: Self-pay | Admitting: Internal Medicine

## 2014-11-12 DIAGNOSIS — J029 Acute pharyngitis, unspecified: Secondary | ICD-10-CM

## 2014-11-12 LAB — POCT RAPID STREP A (OFFICE): RAPID STREP A SCREEN: NEGATIVE

## 2014-11-12 NOTE — Progress Notes (Signed)
Mr. Mayford KnifeWilliams was here with his son Renae FickleCylas, who was seeing Dr. Patsy Lageropland.  Dr. Patsy Lageropland had me swab dad's throat which was negative. He was scheduled to see Nicki ReaperRegina Baity but left after son was seen.

## 2014-11-12 NOTE — Progress Notes (Signed)
   Dr. Karleen HampshireSpencer T. Evanny Ellerbe, MD, CAQ [facility] [address] [place], [id number] Phone: [phone] Fax: [phone]  11/12/2014  Patient: Jonathan CarasClay Cuadros, MRN: [id number], DOB: 10/31/1980, 34 y.o.  Primary Physician:  Hannah BeatSpencer Sloane Junkin, MD  Chief Complaint: Sore Throat and Cough   I saw the patient, and he was doing fairly well, did have some sore throat. We did a rapid strep test, and this was negative. At the time I was evaluating the patient sign. He was nontoxic. Throat was minimally red.  Electronically Signed  By: Hannah BeatSpencer Genita Nilsson, MD On: 11/12/2014 5:06 PM   Results for orders placed or performed in visit on 11/12/14  POCT rapid strep A  Result Value Ref Range   Rapid Strep A Screen Negative Negative

## 2014-11-12 NOTE — Progress Notes (Signed)
Pre visit review using our clinic review tool, if applicable. No additional management support is needed unless otherwise documented below in the visit note. 

## 2014-11-14 ENCOUNTER — Ambulatory Visit: Payer: Managed Care, Other (non HMO) | Admitting: Internal Medicine

## 2016-01-11 ENCOUNTER — Ambulatory Visit (INDEPENDENT_AMBULATORY_CARE_PROVIDER_SITE_OTHER): Payer: BLUE CROSS/BLUE SHIELD | Admitting: Internal Medicine

## 2016-01-11 ENCOUNTER — Encounter: Payer: Self-pay | Admitting: Internal Medicine

## 2016-01-11 VITALS — BP 142/90 | HR 81 | Temp 98.2°F | Wt 298.0 lb

## 2016-01-11 DIAGNOSIS — J069 Acute upper respiratory infection, unspecified: Secondary | ICD-10-CM

## 2016-01-11 DIAGNOSIS — J309 Allergic rhinitis, unspecified: Secondary | ICD-10-CM | POA: Diagnosis not present

## 2016-01-11 MED ORDER — HYDROCODONE-HOMATROPINE 5-1.5 MG/5ML PO SYRP: 5.0000 mL | ORAL_SOLUTION | Freq: Three times a day (TID) | ORAL | Status: DC | PRN

## 2016-01-11 NOTE — Patient Instructions (Signed)

## 2016-01-11 NOTE — Progress Notes (Signed)
Pre visit review using our clinic review tool, if applicable. No additional management support is needed unless otherwise documented below in the visit note. 

## 2016-01-11 NOTE — Progress Notes (Signed)
HPI  Pt presents to the clinic today with c/o runny nose, sore throat and cough. This started 5 days ago. He is blowing clear mucous out of his nose, very rarely. He has had some difficulty swallowing.The cough is productive or yellow/brown mucous, usually in the morning. The cough is worse at night. He denies fever, chills, body aches or shortness of breath. He has tried Dayquil, Nyquil with some relief. He has no history of allergies or asthma. He has had sick contacts.  Review of Systems      Past Medical History  Diagnosis Date  . Depression   . History of nephrolithiasis     Family History  Problem Relation Age of Onset  . Hypertension    . Cancer    . Arthritis Mother   . Arthritis Father     Social History   Social History  . Marital Status: Single    Spouse Name: N/A  . Number of Children: N/A  . Years of Education: N/A   Occupational History  . technician    Social History Main Topics  . Smoking status: Current Some Day Smoker    Types: Cigars  . Smokeless tobacco: Never Used     Comment: May smoke a cigar every month or two  . Alcohol Use: 0.0 oz/week    0 Standard drinks or equivalent per week     Comment: occasional  . Drug Use: No     Comment: history of MJ,NASAL COCAINE,PILLS  . Sexual Activity: Not on file   Other Topics Concern  . 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  HEENT:  Positive runny nose, sore throat. Denies eye redness, eye pain, pressure behind the eyes, facial pain, nasal congestion, ear pain, ringing in the ears, wax buildup, or bloody nose. Respiratory: Positive cough. Denies difficulty breathing or shortness of breath.  Cardiovascular: Denies chest pain, chest tightness, palpitations or swelling in the hands or feet.   No other specific complaints in a complete  review of systems (except as listed in HPI above).  Objective:   BP 142/90 mmHg  Pulse 81  Temp(Src) 98.2 F (36.8 C) (Oral)  Wt 298 lb (135.172 kg)  SpO2 97% Wt Readings from Last 3 Encounters:  01/11/16 298 lb (135.172 kg)  11/01/13 282 lb 12 oz (128.255 kg)  10/21/13 277 lb 12 oz (125.987 kg)     General: Appears his stated age,  in NAD. HEENT: Head: normal shape and size, no sinus tenderness; Eyes: sclera white, no icterus, conjunctiva pink; Right Ear: Tm's gray and intact, normal light reflex; Left Ear: cerumen impaction. Nose: mucosa pink and moist, septum midline; Throat/Mouth: + PND. Teeth present, mucosa erythematous and moist, no exudate noted, no lesions or ulcerations noted.  Neck: Cervical lymphadenopathy, R<L.  Cardiovascular: Normal rate and rhythm. S1,S2 noted.  No murmur, rubs or gallops noted.  Pulmonary/Chest: Normal effort and positive vesicular breath sounds. No respiratory distress. No wheezes, rales or ronchi noted.      Assessment & Plan:   Viral Upper Respiratory Infection/Allergic Rhinitis:  RST: negative Get some rest and drink plenty of water Do salt water gargles for the sore throat Start Zyrtec daily OTC Rx for Hycodan cough syrup  RTC as needed or if symptoms persist.

## 2016-01-22 DIAGNOSIS — J189 Pneumonia, unspecified organism: Secondary | ICD-10-CM | POA: Diagnosis not present

## 2016-01-22 DIAGNOSIS — J209 Acute bronchitis, unspecified: Secondary | ICD-10-CM | POA: Diagnosis not present

## 2016-01-22 DIAGNOSIS — R05 Cough: Secondary | ICD-10-CM | POA: Diagnosis not present

## 2016-01-26 ENCOUNTER — Ambulatory Visit (INDEPENDENT_AMBULATORY_CARE_PROVIDER_SITE_OTHER): Payer: BLUE CROSS/BLUE SHIELD | Admitting: Family

## 2016-01-26 ENCOUNTER — Ambulatory Visit
Admission: RE | Admit: 2016-01-26 | Discharge: 2016-01-26 | Disposition: A | Discharge status: Home / Self Care | Payer: BLUE CROSS/BLUE SHIELD | Source: Ambulatory Visit | Attending: Family | Admitting: Family

## 2016-01-26 ENCOUNTER — Encounter: Payer: Self-pay | Admitting: Family

## 2016-01-26 VITALS — BP 138/80 | HR 96 | Temp 98.7°F | Ht 73.0 in | Wt 300.8 lb

## 2016-01-26 DIAGNOSIS — R05 Cough: Secondary | ICD-10-CM

## 2016-01-26 DIAGNOSIS — R059 Cough, unspecified: Secondary | ICD-10-CM

## 2016-01-26 MED ORDER — BENZONATATE 100 MG PO CAPS: 100.0000 mg | ORAL_CAPSULE | Freq: Three times a day (TID) | ORAL | Status: DC | PRN

## 2016-01-26 NOTE — Progress Notes (Signed)
Pre visit review using our clinic review tool, if applicable. No additional management support is needed unless otherwise documented below in the visit note. 

## 2016-01-26 NOTE — Progress Notes (Signed)
Subjective:    Patient ID: Jonathan Harper, male    DOB: 25-Dec-1980, 35 y.o.   MRN: [id number]   Jonathan Harper is a 35 y.o. male who presents today for an acute visit.    HPI Comments: Jonathan Harper to [facility] last week and completed albuterol, zpack, prednisone which finished today, without improvement.Per chart review, patient was seen 01/11/2016 for cough and treated with Hycodan cough syrup.  Former smoker. H/o gerd.  Cough This is a new problem. The current episode started more than 1 month ago. The problem has been gradually worsening. The cough is productive of sputum. Associated symptoms include headaches (improved 'had felt sinus pressure'), postnasal drip, a sore throat, shortness of breath and wheezing. Pertinent negatives include no chest pain, chills, ear pain or fever. The symptoms are aggravated by lying down. He has tried a beta-agonist inhaler (flonase) for the symptoms. The treatment provided mild relief. There is no history of asthma, environmental allergies or pneumonia.   Past Medical History  Diagnosis Date  . Depression   . History of nephrolithiasis    Allergies: Review of patient's allergies indicates no known allergies. Current Outpatient Prescriptions on File Prior to Visit  Medication Sig Dispense Refill  . famotidine (PEPCID) 10 MG tablet Take 10 mg by mouth every morning.      Marland Kitchen HYDROcodone-homatropine (HYCODAN) 5-1.5 MG/5ML syrup Take 5 mLs by mouth every 8 (eight) hours as needed for cough. 120 mL 0   No current facility-administered medications on file prior to visit.    Social History  Substance Use Topics  . Smoking status: Current Some Day Smoker    Types: Cigars  . Smokeless tobacco: Never Used     Comment: May smoke a cigar every month or two  . Alcohol Use: 0.0 oz/week    0 Standard drinks or equivalent per week     Comment: occasional    Review of Systems  Constitutional: Negative for fever and chills.  HENT: Positive for congestion,  postnasal drip, sinus pressure and sore throat. Negative for ear pain.   Respiratory: Positive for cough, shortness of breath and wheezing.   Cardiovascular: Negative for chest pain and palpitations.  Gastrointestinal: Negative for nausea and vomiting.  Allergic/Immunologic: Negative for environmental allergies.  Neurological: Positive for headaches (improved 'had felt sinus pressure').      Objective:    BP 138/80 mmHg  Pulse 96  Temp(Src) 98.7 F (37.1 C) (Oral)  Ht  (1.854 m)  Wt 300 lb 12.8 oz (136.442 kg)  BMI 39.69 kg/m2  SpO2 96%   Physical Exam  Constitutional: Vital signs are normal. He appears well-developed and well-nourished.  HENT:  Head: Normocephalic and atraumatic.  Right Ear: Hearing, tympanic membrane, external ear and ear canal normal. No drainage, swelling or tenderness. Tympanic membrane is not injected, not erythematous and not bulging. No middle ear effusion. No decreased hearing is noted.  Left Ear: Hearing, tympanic membrane, external ear and ear canal normal. No drainage, swelling or tenderness. Tympanic membrane is not injected, not erythematous and not bulging.  No middle ear effusion. No decreased hearing is noted.  Nose: Rhinorrhea present. Right sinus exhibits no maxillary sinus tenderness and no frontal sinus tenderness. Left sinus exhibits no maxillary sinus tenderness and no frontal sinus tenderness.  Mouth/Throat: Uvula is midline and mucous membranes are normal. Posterior oropharyngeal erythema present. No oropharyngeal exudate, posterior oropharyngeal edema or tonsillar abscesses.  Eyes: Conjunctivae are normal.  Cardiovascular: Regular rhythm and normal heart sounds.  Pulmonary/Chest: Effort normal and breath sounds normal. No respiratory distress. He has no wheezes. He has no rhonchi. He has no rales.  Lymphadenopathy:       Head (right side): No submental, no submandibular, no tonsillar, no preauricular, no posterior auricular and no  occipital adenopathy present.       Head (left side): No submental, no submandibular, no tonsillar, no preauricular, no posterior auricular and no occipital adenopathy present.    He has no cervical adenopathy.  Neurological: He is alert.  Skin: Skin is warm and dry.  Psychiatric: He has a normal mood and affect. His speech is normal and behavior is normal.  Vitals reviewed.      Assessment & Plan:   1. Cough Patient and I'll improve on prednisone and azithromycin. Working diagnosis of viral URI. Alternately, post viral cough. Due to duration of symptoms, pending chest x-ray to evaluate for pneumonia.  - benzonatate (TESSALON PERLES) 100 MG capsule; Take 1 capsule (100 mg total) by mouth 3 (three) times daily as needed for cough.  Dispense: 30 capsule; Refill: 1 - DG Chest 2 View; Future    I am having Jonathan Harper start on benzonatate. I am also having him maintain his famotidine, HYDROcodone-homatropine, and VENTOLIN HFA.   Meds ordered this encounter  Medications  . VENTOLIN HFA 108 (90 Base) MCG/ACT inhaler    Sig: Inhale 1 puff into the lungs every 6 (six) hours.    Refill:  0  . benzonatate (TESSALON PERLES) 100 MG capsule    Sig: Take 1 capsule (100 mg total) by mouth 3 (three) times daily as needed for cough.    Dispense:  30 capsule    Refill:  1    Order Specific Question:  Supervising Provider    Answer:  Tresa GarterPLOTNIKOV, ALEKSEI V [[id number]]     Start medications as prescribed and explained to patient on After Visit Summary ( AVS). Risks, benefits, and alternatives of the medications and treatment plan prescribed today were discussed, and patient expressed understanding.   Education regarding symptom management and diagnosis given to patient.   Follow-up:Plan follow-up as discussed or as needed if any worsening symptoms or change in condition.   Continue to follow with Hannah BeatSpencer Copland, MD for routine health maintenance.   Mabeline Caraslay Galeas and I agreed with plan.    Rennie PlowmanMargaret Arnett, FNP

## 2016-01-26 NOTE — Patient Instructions (Signed)
Hang in there.   Increase intake of clear fluids. Congestion is best treated by hydration, when mucus is wetter, it is thinner, less sticky, and easier to expel from the body, either through coughing up drainage, or by blowing your nose.   Get plenty of rest.   Use saline nasal drops and blow your nose frequently. Run a humidifier at night and elevate the head of the bed. Vicks Vapor rub will help with congestion and cough. Steam showers and sinus massage for congestion.   Use Acetaminophen or Ibuprofen as needed for fever or pain. Avoid second hand smoke. Even the smallest exposure will worsen symptoms.   Over the counter medications you can try include Delsym for cough, a decongestant for congestion, and Mucinex or Robitussin as an expectorant. Be sure to just get the plain Mucinex or Robitussin that just has one medication (Guaifenesen). We don't recommend the combination products. Note, be sure to drink two glasses of water with each dose of Mucinex as the medication will not work well without adequate hydration.   You can also try a teaspoon of honey to see if this will help reduce cough. Throat lozenges can sometimes be beneficial as well.    This illness will typically last 7 - 10 days.   Please follow up with our clinic if you develop a fever greater than 101 F, symptoms worsen, or do not resolve in the next week.

## 2016-02-02 ENCOUNTER — Ambulatory Visit (INDEPENDENT_AMBULATORY_CARE_PROVIDER_SITE_OTHER): Payer: BLUE CROSS/BLUE SHIELD | Admitting: Family Medicine

## 2016-02-02 ENCOUNTER — Encounter: Payer: Self-pay | Admitting: Family Medicine

## 2016-02-02 VITALS — BP 134/92 | HR 103 | Temp 98.4°F | Ht 73.0 in | Wt 300.4 lb

## 2016-02-02 DIAGNOSIS — R05 Cough: Secondary | ICD-10-CM

## 2016-02-02 DIAGNOSIS — R059 Cough, unspecified: Secondary | ICD-10-CM

## 2016-02-02 MED ORDER — ALBUTEROL SULFATE (2.5 MG/3ML) 0.083% IN NEBU
2.5000 mg | INHALATION_SOLUTION | Freq: Once | RESPIRATORY_TRACT | Status: AC
Start: 2016-02-02 — End: 2016-02-02
  Administered 2016-02-02: 2.5 mg via RESPIRATORY_TRACT

## 2016-02-02 MED ORDER — PREDNISONE 50 MG PO TABS: ORAL_TABLET | ORAL | Status: DC

## 2016-02-02 MED ORDER — HYDROCOD POLST-CPM POLST ER 10-8 MG/5ML PO SUER: 5.0000 mL | Freq: Two times a day (BID) | ORAL | Status: DC | PRN

## 2016-02-02 NOTE — Patient Instructions (Signed)
Take the prednisone and use the tussionex as prescribed.  Follow up if you worsen.  Take care  Dr. Adriana Simasook

## 2016-02-02 NOTE — Progress Notes (Signed)
Pre visit review using our clinic review tool, if applicable. No additional management support is needed unless otherwise documented below in the visit note. 

## 2016-02-03 DIAGNOSIS — R05 Cough: Secondary | ICD-10-CM | POA: Insufficient documentation

## 2016-02-03 DIAGNOSIS — R059 Cough, unspecified: Secondary | ICD-10-CM | POA: Insufficient documentation

## 2016-02-03 NOTE — Assessment & Plan Note (Signed)
Has had for nearly 1 month (chronic cough). Likely post viral. Given albuterol nebulizer treatment today with improvement in his lung exam. We'll continue use of albuterol.  Tussionex for cough. Given smoking history and wheezing on exam, treating with a course of prednisone. Has had recent chest x-ray which was negative. No indications for antibiotics at this time. If continues to persist, would send to pulmonary for PFT's/evaluation.

## 2016-02-03 NOTE — Progress Notes (Signed)
Subjective:  Patient ID: Jonathan Harper, male    DOB: 1981-08-10  Age: 35 y.o. MRN: [id number]  CC: Cough  HPI:  35 year old male presents with complaints of cough.  Cough  Patient states she's been sick for the past month.  He has been seen twice this month for URI and cough.  He has been treated with Hycodan, Tessalon, Albuterol.  He's had some improvement with treatment but no resolution.  He states that the cough syrup worked fairly well.  He reports he still has significant cough and has developed right lower rib pain. He states the pain is severe and worse with a cough.  He does endorse some shortness of breath.  No associated fevers or chills. Cough is productive of discolored sputum.  Social Hx   Social History   Social History  . Marital Status: Single    Spouse Name: N/A  . Number of Children: N/A  . Years of Education: N/A   Occupational History  . technician    Social History Main Topics  . Smoking status: Current Some Day Smoker    Types: Cigars  . Smokeless tobacco: Never Used     Comment: May smoke a cigar every month or two  . Alcohol Use: 0.0 oz/week    0 Standard drinks or equivalent per week     Comment: occasional  . Drug Use: No     Comment: history of MJ,NASAL COCAINE,PILLS  . Sexual Activity: Not Asked   Other Topics Concern  . 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  Respiratory: Positive for cough and shortness of breath.     Objective:  BP 134/92 mmHg  Pulse 103  Temp(Src) 98.4 F (36.9 C) (Oral)  Ht 6\' 1"  (1.854 m)  Wt 300 lb 6 oz (136.249 kg)  BMI 39.64 kg/m2  SpO2 97%  BP/Weight 02/02/2016 01/26/2016 01/11/2016  Systolic BP 134 138 142  Diastolic BP 92 80 90  Wt. (Lbs) 300.38 300.8 298  BMI 39.64 39.69 39.32   Physical Exam  Constitutional: He is oriented to person, place, and time.  Obese male no  acute distress.  HENT:  Mouth/Throat: Oropharynx is clear and moist.  Eyes: Conjunctivae are normal. No scleral icterus.  Cardiovascular: Regular rhythm.  Tachycardia present.   Pulmonary/Chest: Effort normal.  Diffuse expiratory wheezing noted.  Neurological: He is alert and oriented to person, place, and time.  Psychiatric: He has a normal mood and affect.  Vitals reviewed.  Lab Results  Component Value Date   WBC 10.3 04/04/2011   HGB 14.0 04/04/2011   HCT 41.5 04/04/2011   PLT 272 04/04/2011   GLUCOSE 99 04/04/2011   ALT 15 04/04/2011   AST 12 04/04/2011   NA 136 04/04/2011   K 3.6 04/04/2011   CL 101 04/04/2011   CREATININE 0.85 04/04/2011   BUN 12 04/04/2011   CO2 27 04/04/2011   Assessment & Plan:   Problem List Items Addressed This Visit    Cough - Primary    Has had for nearly 1 month (chronic cough). Likely post viral. Given albuterol nebulizer treatment today with improvement in his lung exam. We'll continue use of albuterol.  Tussionex for cough. Given smoking history and wheezing on exam, treating with a course of prednisone. Has had recent chest x-ray which was negative. No indications for antibiotics at this  time. If continues to persist, would send to pulmonary for PFT's/evaluation.      Relevant Medications   albuterol (PROVENTIL) (2.5 MG/3ML) 0.083% nebulizer solution 2.5 mg (Completed)      Meds ordered this encounter  Medications  . albuterol (PROVENTIL) (2.5 MG/3ML) 0.083% nebulizer solution 2.5 mg    Sig:   . predniSONE (DELTASONE) 50 MG tablet    Sig: 1 tablet daily x 5 days.    Dispense:  5 tablet    Refill:  0  . chlorpheniramine-HYDROcodone (TUSSIONEX PENNKINETIC ER) 10-8 MG/5ML SUER    Sig: Take 5 mLs by mouth every 12 (twelve) hours as needed.    Dispense:  115 mL    Refill:  0   Follow-up: PRN  Everlene Other DO [facility]

## 2017-03-21 IMAGING — CR DG CHEST 2V
1 series · 2 of 2 positions shown · non-contrast
Comparison: None.

CLINICAL DATA: Productive cough for 1 month

EXAM:
CHEST  2 VIEW

[Series 1: dg chest 2 view · 0.14mm/px · 2 of 2 slices shown]
[im 1/2]
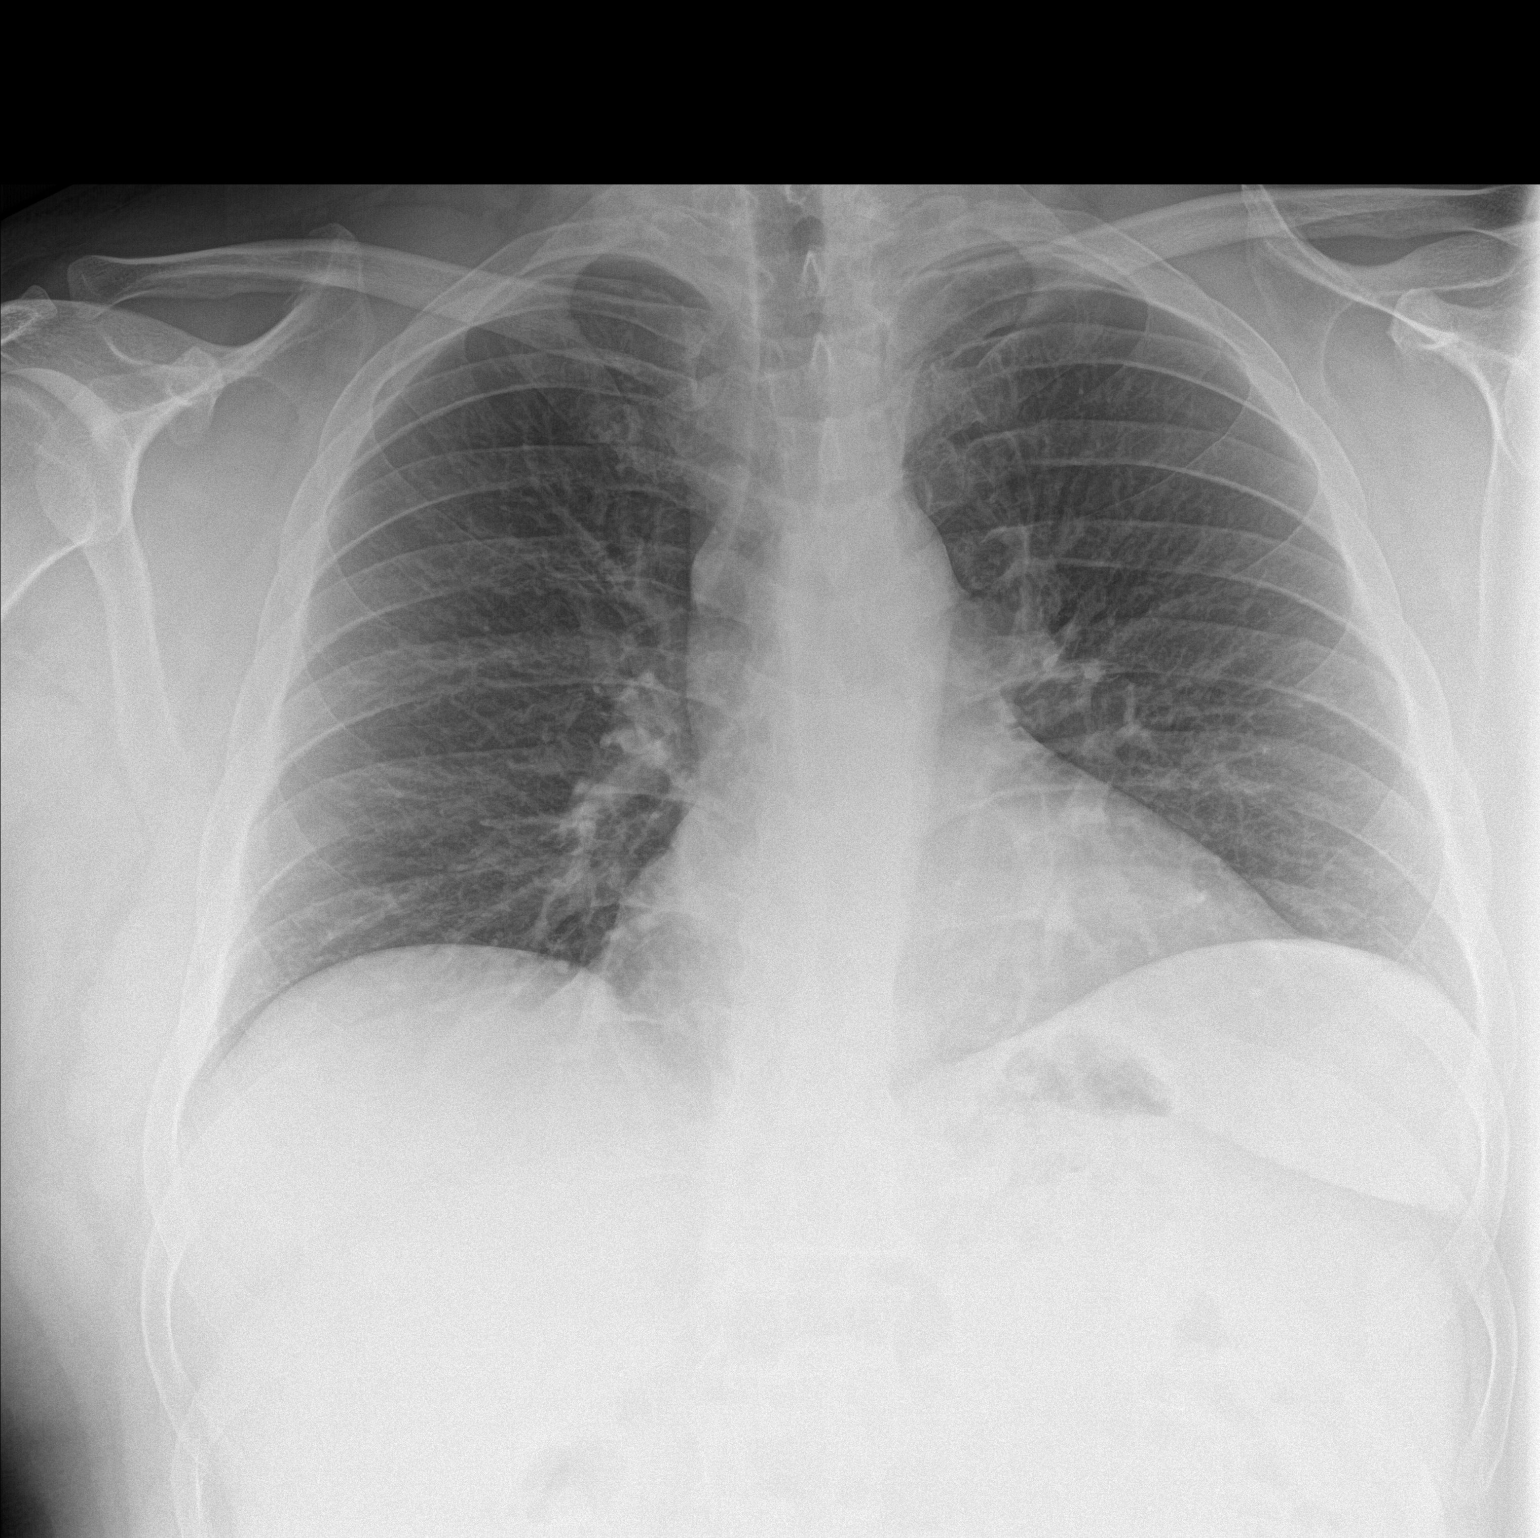
[im 2/2]
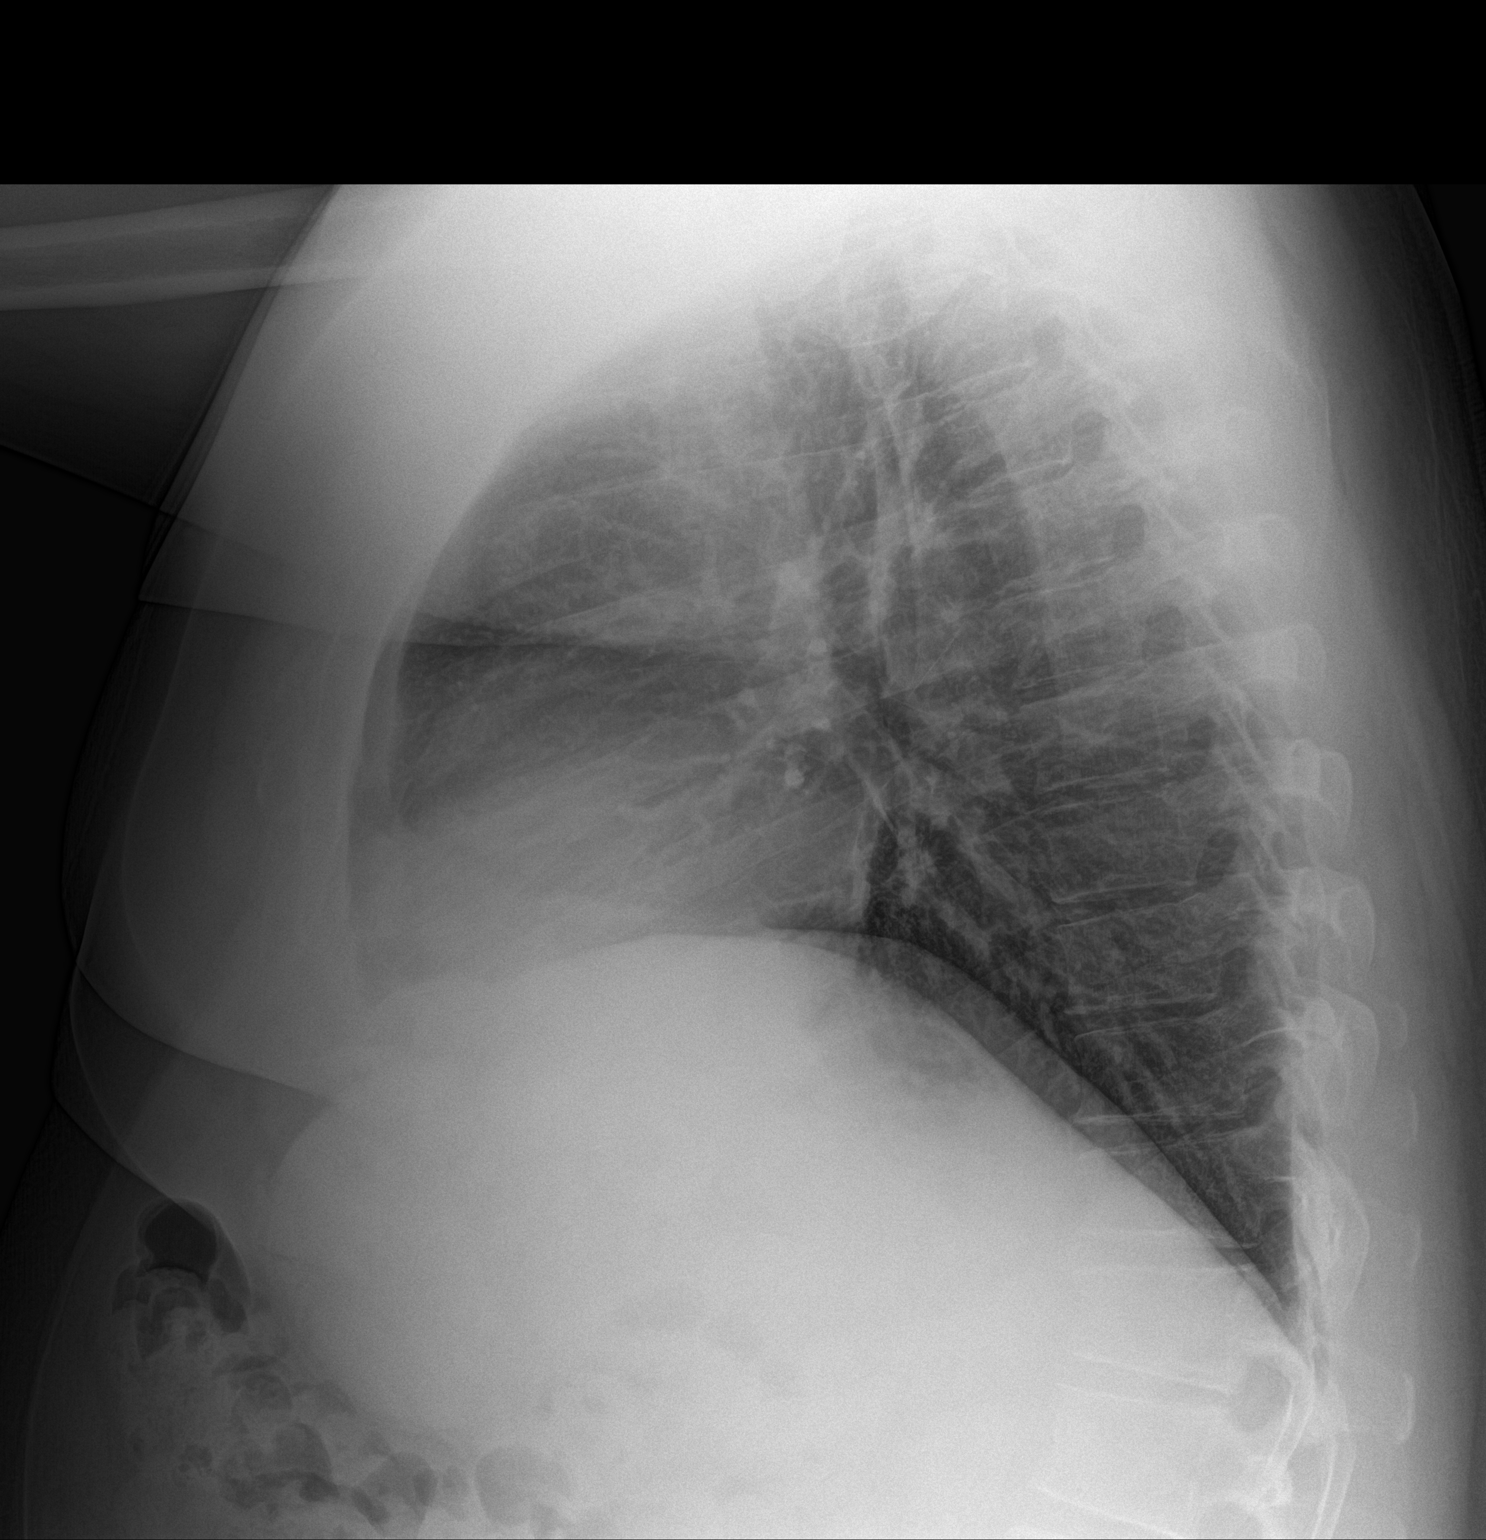

[2 of 2 positions shown; findings below may reference images not displayed]

FINDINGS: The heart size and mediastinal contours are within normal limits.
Both lungs are clear. The visualized skeletal structures are
unremarkable.
IMPRESSION: No active cardiopulmonary disease.

## 2017-06-12 ENCOUNTER — Encounter: Payer: Self-pay | Admitting: Family Medicine

## 2017-06-12 ENCOUNTER — Ambulatory Visit: Payer: BLUE CROSS/BLUE SHIELD | Admitting: Family Medicine

## 2017-06-12 ENCOUNTER — Ambulatory Visit (INDEPENDENT_AMBULATORY_CARE_PROVIDER_SITE_OTHER): Payer: BLUE CROSS/BLUE SHIELD | Admitting: Family Medicine

## 2017-06-12 VITALS — BP 136/88 | HR 82 | Temp 98.3°F | Wt 302.5 lb

## 2017-06-12 DIAGNOSIS — Z87898 Personal history of other specified conditions: Secondary | ICD-10-CM | POA: Diagnosis not present

## 2017-06-12 DIAGNOSIS — Z6839 Body mass index (BMI) 39.0-39.9, adult: Secondary | ICD-10-CM | POA: Diagnosis not present

## 2017-06-12 DIAGNOSIS — Z23 Encounter for immunization: Secondary | ICD-10-CM | POA: Diagnosis not present

## 2017-06-12 DIAGNOSIS — R400 Somnolence: Secondary | ICD-10-CM

## 2017-06-12 DIAGNOSIS — E669 Obesity, unspecified: Secondary | ICD-10-CM | POA: Diagnosis not present

## 2017-06-12 LAB — COMPREHENSIVE METABOLIC PANEL
ALBUMIN: 4.1 g/dL (ref 3.5–5.2)
ALK PHOS: 86 U/L (ref 39–117)
ALT: 15 U/L (ref 0–53)
AST: 12 U/L (ref 0–37)
BUN: 10 mg/dL (ref 6–23)
CO2: 30 mEq/L (ref 19–32)
CREATININE: 0.78 mg/dL (ref 0.40–1.50)
Calcium: 9.4 mg/dL (ref 8.4–10.5)
Chloride: 101 mEq/L (ref 96–112)
GFR: 118.74 mL/min (ref >60.00)
Glucose, Bld: 93 mg/dL (ref 70–99)
Potassium: 4.1 mEq/L (ref 3.5–5.1)
SODIUM: 137 meq/L (ref 135–145)
Total Bilirubin: 0.5 mg/dL (ref 0.2–1.2)
Total Protein: 7.3 g/dL (ref 6.0–8.3)

## 2017-06-12 LAB — LIPID PANEL
Cholesterol: 158 mg/dL (ref 0–200)
HDL: 34.9 mg/dL — ABNORMAL LOW (ref >39.00)
LDL Cholesterol: 100 mg/dL — ABNORMAL HIGH (ref 0–99)
NONHDL: 122.81
Total CHOL/HDL Ratio: 5
Triglycerides: 114 mg/dL (ref 0.0–149.0)
VLDL: 22.8 mg/dL (ref 0.0–40.0)

## 2017-06-12 LAB — CBC
HEMATOCRIT: 43.1 % (ref 39.0–52.0)
Hemoglobin: 14.3 g/dL (ref 13.0–17.0)
MCHC: 33.1 g/dL (ref 30.0–36.0)
MCV: 91.6 fl (ref 78.0–100.0)
Platelets: 278 10*3/uL (ref 150.0–400.0)
RBC: 4.7 Mil/uL (ref 4.22–5.81)
RDW: 13.9 % (ref 11.5–15.5)
WBC: 6.8 10*3/uL (ref 4.0–10.5)

## 2017-06-12 LAB — HEMOGLOBIN A1C: HEMOGLOBIN A1C: 6.3 % (ref 4.6–6.5)

## 2017-06-12 NOTE — Progress Notes (Signed)
   Subjective:    Patient ID: Jonathan Harper, male    DOB: [birth year], 36 y.o.   MRN: [id number]  HPI This is a 36 yo male who presents today with complaint of daytime fatigue, excessive snoring, fatigue. Feels "dead tired," has difficulty staying awake during the day. Has been told he has periods of apnea when sleeping. Has family history of sleep apnea. Works in Consulting civil engineer, sits. Enjoys camping, hiking, outdoors. Has 37 yo, 36 yo.  Quit smoking 2 weeks ago. Is trying to lose weight with Weight [employer]- just started.  No chest pain, no SOB.  No recent CPE, no labs since 2012.     Past Medical History:  Diagnosis Date  . Depression   . History of nephrolithiasis    Past Surgical History:  Procedure Laterality Date  . CHOLECYSTECTOMY    . FOOT SURGERY    . heel spur removal     Family History  Problem Relation Age of Onset  . Hypertension Unknown   . Cancer Unknown   . Arthritis Mother   . Arthritis Father    Social History  Substance Use Topics  . Smoking status: Current Some Day Smoker    Types: Cigars  . Smokeless tobacco: Never Used     Comment: May smoke a cigar every month or two  . Alcohol use 0.0 oz/week     Comment: occasional      Review of Systems Per HPI    Objective:   Physical Exam  Constitutional: He is oriented to person, place, and time. He appears well-developed and well-nourished. No distress.  Obese.   HENT:  Head: Normocephalic and atraumatic.  Mouth/Throat: Oropharynx is clear and moist.  Eyes: Conjunctivae are normal.  Neck: Normal range of motion. Neck supple.  Thick neck.   Cardiovascular: Normal rate, regular rhythm and normal heart sounds.   Pulmonary/Chest: Effort normal and breath sounds normal.  Musculoskeletal: He exhibits no edema.  Neurological: He is alert and oriented to person, place, and time.  Skin: Skin is warm and dry. He is not diaphoretic.  Psychiatric: He has a normal mood and affect. His behavior is normal. Judgment and  thought content normal.  Vitals reviewed.     BP 136/88 (BP Location: Left Arm, Patient Position: Sitting, Cuff Size: Large)   Pulse 82   Temp 98.3 F (36.8 C) (Oral)   Wt (!) 302 lb 8 oz (137.2 kg)   SpO2 96%   BMI 39.91 kg/m  Wt Readings from Last 3 Encounters:  06/12/17 (!) 302 lb 8 oz (137.2 kg)  02/02/16 (!) 300 lb 6 oz (136.2 kg)  01/26/16 (!) 300 lb 12.8 oz (136.4 kg)     Assessment & Plan:  1. BMI 39.0-39.9,adult - encouraged continued healthy food choices - Ambulatory referral to Pulmonology - CBC - Comprehensive metabolic panel - Hemoglobin A1c - Lipid panel  2. Need for influenza vaccination - Flu Vaccine QUAD 6+ mos PF IM (Fluarix Quad PF)  3. History of snoring - Ambulatory referral to Pulmonology  4. Daytime somnolence - Ambulatory referral to Pulmonology  - follow up with PCP for CPE  Olean Ree, FNP-BC  [facility] [place], [facility]  06/12/2017 10:36 AM

## 2017-06-12 NOTE — Patient Instructions (Signed)
Please see Shirlee Limerick to schedule appointment with pulmonary  I will notify you of lab results in 3-5 days

## 2017-06-15 ENCOUNTER — Telehealth: Payer: Self-pay | Admitting: Family Medicine

## 2017-06-15 NOTE — Telephone Encounter (Signed)
Caller Name:Severino Done Relationship to Patient:self Best number:[phone] Pharmacy:  Reason for call: returning call about labs

## 2017-06-16 NOTE — Telephone Encounter (Signed)
Patient contacted about results per Zollie Pee (refer to lab result notes).

## 2017-07-11 ENCOUNTER — Encounter: Payer: Self-pay | Admitting: Pulmonary Disease

## 2017-07-11 ENCOUNTER — Ambulatory Visit (INDEPENDENT_AMBULATORY_CARE_PROVIDER_SITE_OTHER): Payer: BLUE CROSS/BLUE SHIELD | Admitting: Pulmonary Disease

## 2017-07-11 VITALS — BP 124/76 | HR 82 | Ht 73.0 in | Wt 302.0 lb

## 2017-07-11 DIAGNOSIS — G4733 Obstructive sleep apnea (adult) (pediatric): Secondary | ICD-10-CM | POA: Diagnosis not present

## 2017-07-11 NOTE — Progress Notes (Signed)
Subjective:    Patient ID: Jonathan Harper, male    DOB: 06-Sep-1981, 36 y.o.   MRN: [id number]  HPI  36 year old obese man presents for evaluation of sleep disordered breathing. Loud snoring has been noted by family members for many years, his dad was diagnosed with OSA and he has seen him use a CPAP machine with dramatic benefits.  Wife has reported witnessed apneas.  He feels always tired and non-refreshing sleep.  Epworth sleepiness score is 22 and he reports sleepiness in various situations including the passenger in a car, watching TV, is a sitting inactive anabolic place and lately even while driving.  Bedtime is between 10 and 11 PM, sleep latency is minimal, sleeps on his back with one pillow, denies nocturnal awakenings or nocturia and is out of bed at 6 AM feeling tired with dryness of mouth and a sore throat due to loud snoring.  He has been overweight for many years and his weight has not fluctuated in the last 2 years.  He drinks a 12 ounce cup of coffee in the mornings and denies sodas or energy drinks  There is no history suggestive of cataplexy, sleep paralysis or parasomnias He has tried oral appliance in the past without benefit including the tongue retainer and simple mouth guards   Past Medical History:  Diagnosis Date  . Depression   . History of nephrolithiasis       Past Surgical History:  Procedure Laterality Date  . CHOLECYSTECTOMY    . FOOT SURGERY    . heel spur removal      No Known Allergies  Social History   Social History  . Marital status: Single    Spouse name: N/A  . Number of children: N/A  . Years of education: N/A   Occupational History  . technician [employer]   Social History Main Topics  . Smoking status: Former Smoker    Types: Cigars    Quit date: 06/11/2017  . Smokeless tobacco: Never Used     Comment: May smoke a cigar every month or two  . Alcohol use 0.0 oz/week     Comment: occasional  . Drug use: No     Comment:  history of MJ,NASAL COCAINE,PILLS  . Sexual activity: Not on file   Other Topics Concern  . 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 Hypertension Unknown   . Cancer Unknown   . Arthritis Mother   . Arthritis Father      Review of Systems Positive for acid heartburn  Constitutional: negative for anorexia, fevers and sweats  Eyes: negative for irritation, redness and visual disturbance  Ears, nose, mouth, throat, and face: negative for earaches, epistaxis, nasal congestion and sore throat  Respiratory: negative for cough, dyspnea on exertion, sputum and wheezing  Cardiovascular: negative for chest pain, dyspnea, lower extremity edema, orthopnea, palpitations and syncope  Gastrointestinal: negative for abdominal pain, constipation, diarrhea, melena, nausea and vomiting  Genitourinary:negative for dysuria, frequency and hematuria  Hematologic/lymphatic: negative for bleeding, easy bruising and lymphadenopathy  Musculoskeletal:negative for arthralgias, muscle weakness and stiff joints  Neurological: negative for coordination problems, gait problems, headaches and weakness  Endocrine: negative for diabetic symptoms including polydipsia, polyuria and weight loss     Objective:   Physical Exam  Gen. Pleasant, obese, in no distress, normal  affect ENT - no lesions, no post nasal drip, class 2-3 airway Neck: No JVD, no thyromegaly, no carotid bruits Lungs: no use of accessory muscles, no dullness to percussion, decreased without rales or rhonchi  Cardiovascular: Rhythm regular, heart sounds  normal, no murmurs or gallops, no peripheral edema Abdomen: soft and non-tender, no hepatosplenomegaly, BS normal. Musculoskeletal: No deformities, no cyanosis or clubbing Neuro:  alert, non focal, no tremors       Assessment & Plan:

## 2017-07-11 NOTE — Patient Instructions (Signed)
Home sleep study 

## 2017-07-11 NOTE — Assessment & Plan Note (Signed)
Given excessive daytime somnolence, narrow pharyngeal exam, witnessed apneas & loud snoring, obstructive sleep apnea is very likely & an overnight polysomnogram will be scheduled as a home study. The pathophysiology of obstructive sleep apnea , it's cardiovascular consequences & modes of treatment including CPAP were discused with the patient in detail & they evidenced understanding.  Pretest probability is high.  He will likely need a CPAP titration study after

## 2017-07-25 DIAGNOSIS — G4733 Obstructive sleep apnea (adult) (pediatric): Secondary | ICD-10-CM | POA: Diagnosis not present

## 2017-07-26 DIAGNOSIS — G4733 Obstructive sleep apnea (adult) (pediatric): Secondary | ICD-10-CM | POA: Diagnosis not present

## 2017-07-28 ENCOUNTER — Telehealth: Payer: Self-pay | Admitting: Pulmonary Disease

## 2017-07-28 NOTE — Telephone Encounter (Signed)
Per RA, HST showed severe OSA with 43 events per hour. Recommends a cpap titration study as the next step.

## 2017-08-01 ENCOUNTER — Other Ambulatory Visit: Payer: Self-pay | Admitting: *Deleted

## 2017-08-01 DIAGNOSIS — G4733 Obstructive sleep apnea (adult) (pediatric): Secondary | ICD-10-CM

## 2017-08-07 ENCOUNTER — Telehealth: Payer: Self-pay | Admitting: Pulmonary Disease

## 2017-08-07 DIAGNOSIS — G4733 Obstructive sleep apnea (adult) (pediatric): Secondary | ICD-10-CM

## 2017-08-07 NOTE — Telephone Encounter (Signed)
Patient returning call - he can be reached at [phone] -pr

## 2017-08-07 NOTE — Telephone Encounter (Signed)
Per RA, HST showed severe OSA with 43 events per hour. Recommends a cpap titration study as the next step.   Left message for patient to call back.

## 2017-08-07 NOTE — Telephone Encounter (Signed)
Called pt and advised message from the provider. Pt understood and verbalized understanding. Nothing further is needed.   CPAP titration study ordered.

## 2017-09-10 ENCOUNTER — Ambulatory Visit (HOSPITAL_BASED_OUTPATIENT_CLINIC_OR_DEPARTMENT_OTHER): Payer: BLUE CROSS/BLUE SHIELD

## 2017-09-13 ENCOUNTER — Ambulatory Visit (HOSPITAL_BASED_OUTPATIENT_CLINIC_OR_DEPARTMENT_OTHER): Payer: BLUE CROSS/BLUE SHIELD | Attending: Pulmonary Disease | Admitting: Pulmonary Disease

## 2017-09-13 DIAGNOSIS — G4733 Obstructive sleep apnea (adult) (pediatric): Secondary | ICD-10-CM | POA: Diagnosis not present

## 2017-09-21 ENCOUNTER — Telehealth: Payer: Self-pay | Admitting: Pulmonary Disease

## 2017-09-21 NOTE — Telephone Encounter (Signed)
Spoke with patient. Advised him that RA has been out of the office for the week. I will call him once RA has read the SS. He verbalized understanding. Nothing else needed at time of call.

## 2017-09-25 ENCOUNTER — Encounter: Payer: Self-pay | Admitting: Family Medicine

## 2017-09-25 ENCOUNTER — Ambulatory Visit: Payer: BLUE CROSS/BLUE SHIELD | Admitting: Family Medicine

## 2017-09-25 VITALS — BP 126/86 | HR 94 | Temp 98.1°F | Wt 294.5 lb

## 2017-09-25 DIAGNOSIS — J0100 Acute maxillary sinusitis, unspecified: Secondary | ICD-10-CM

## 2017-09-25 MED ORDER — AMOXICILLIN-POT CLAVULANATE 875-125 MG PO TABS: 1.0000 | ORAL_TABLET | Freq: Two times a day (BID) | ORAL | 0 refills | Status: DC

## 2017-09-25 NOTE — Patient Instructions (Signed)
  For muscle aches, headache, sore throat you can take over the counter acetaminophen or ibuprofen as directed on the package For nasal congestion you can use Afrin nasal spray twice a day for up to 3 days, and /or sudafed, and/or saline nasal spray For cough you can use Delsym cough syrup Ibuprofen 2-3 (400-600 mg) tablets every 8-12 hours.   Please come back to see us or go to the emergency department if you are not better in 5 to 7 days or if you develop fever over 101 for more than 48 hours or if you develop wheezing or shortness of breath.

## 2017-09-25 NOTE — Progress Notes (Signed)
Subjective:    Patient ID: Jonathan Harper, male    DOB: 1980-10-06, 37 y.o.   MRN: [id number]  HPI This is a 37 yo male who presents today with sinus problems x 2 weeks. Has been having nasal congestion, headache over eyes. Had fever x 1 day. Had copious amount of yellow nasal drainage. No ear pain or pressure, sore throat at beginning, has resolved. Cough resolved. Has been taking Dayquil/Nyquil which seemed to help at first. Some dizziness and nausea. Known sick contacts.   Past Medical History:  Diagnosis Date  . Depression   . History of nephrolithiasis    Past Surgical History:  Procedure Laterality Date  . CHOLECYSTECTOMY    . FOOT SURGERY    . heel spur removal     Family History  Problem Relation Age of Onset  . Hypertension Unknown   . Cancer Unknown   . Arthritis Mother   . Arthritis Father    Social History   Tobacco Use  . Smoking status: Former Smoker    Types: Cigars    Last attempt to quit: 06/11/2017    Years since quitting: 0.2  . Smokeless tobacco: Never Used  . Tobacco comment: May smoke a cigar every month or two  Substance Use Topics  . Alcohol use: Yes    Alcohol/week: 0.0 oz    Comment: occasional  . Drug use: No    Comment: history of MJ,NASAL COCAINE,PILLS      Review of Systems Per HPI    Objective:   Physical Exam  Constitutional: He is oriented to person, place, and time. He appears well-developed and well-nourished. He appears ill. No distress.  HENT:  Head: Normocephalic and atraumatic.  Right Ear: Tympanic membrane, external ear and ear canal normal.  Left Ear: Tympanic membrane, external ear and ear canal normal.  Nose: Mucosal edema and rhinorrhea present. Right sinus exhibits maxillary sinus tenderness. Right sinus exhibits no frontal sinus tenderness. Left sinus exhibits maxillary sinus tenderness. Left sinus exhibits no frontal sinus tenderness.  Mouth/Throat: Uvula is midline and mucous membranes are normal. Posterior  oropharyngeal erythema present. No oropharyngeal exudate, posterior oropharyngeal edema or tonsillar abscesses.  Eyes: Conjunctivae are normal.  Neck: Normal range of motion. Neck supple.  Cardiovascular: Normal rate, regular rhythm and normal heart sounds.  Pulmonary/Chest: Effort normal and breath sounds normal.  Neurological: He is alert and oriented to person, place, and time.  Skin: Skin is warm and dry. He is not diaphoretic.  Psychiatric: He has a normal mood and affect. His behavior is normal. Judgment and thought content normal.  Vitals reviewed.     BP 126/86 (BP Location: Right Arm, Patient Position: Sitting, Cuff Size: Large)   Pulse 94   Temp 98.1 F (36.7 C) (Oral)   Wt 294 lb 8 oz (133.6 kg)   SpO2 96%   BMI 38.85 kg/m  Wt Readings from Last 3 Encounters:  09/25/17 294 lb 8 oz (133.6 kg)  09/13/17 284 lb (128.8 kg)  07/11/17 (!) 302 lb (137 kg)       Assessment & Plan:  1. Acute non-recurrent maxillary sinusitis - Provided written and verbal information regarding diagnosis and treatment. -  Patient Instructions   For muscle aches, headache, sore throat you can take over the counter acetaminophen or ibuprofen as directed on the package For nasal congestion you can use Afrin nasal spray twice a day for up to 3 days, and /or sudafed, and/or saline nasal spray For cough you can  use Delsym cough syrup Ibuprofen 2-3 (400-600 mg) tablets every 8-12 hours.   Please come back to see [place] or go to the emergency department if you are not better in 5 to 7 days or if you develop fever over 101 for more than 48 hours or if you develop wheezing or shortness of breath.    - amoxicillin-clavulanate (AUGMENTIN) 875-125 MG tablet; Take 1 tablet by mouth 2 (two) times daily.  Dispense: 14 tablet; Refill: 0   Olean Ree, FNP-BC  [facility], [facility]  09/25/2017 10:12 PM

## 2017-09-28 ENCOUNTER — Telehealth: Payer: Self-pay | Admitting: Pulmonary Disease

## 2017-09-28 DIAGNOSIS — R0600 Dyspnea, unspecified: Secondary | ICD-10-CM | POA: Diagnosis not present

## 2017-09-28 DIAGNOSIS — G4733 Obstructive sleep apnea (adult) (pediatric): Secondary | ICD-10-CM

## 2017-09-28 NOTE — Telephone Encounter (Signed)
Auto bilevel - EPAP min 10 , PS 6, IPAP max 24 cm with a Medium size Fisher&Paykel Full Face Mask Simplus mask and heated humidification DL in 4 wks & FU with me

## 2017-09-28 NOTE — Procedures (Signed)
Patient Name: Jonathan Harper, Zayven Study Date: 09/13/2017 Gender: Male D.O.B: 01-30-1981 Age (years): 36 Referring Provider: Cyril Mourningakesh Karalee Hauter MD, ABSM Height (inches): 73 Interpreting Physician: Cyril Mourningakesh Lynnell Fiumara MD, ABSM Weight (lbs): 284 RPSGT: Shelah LewandowskyGregory, Kenyon BMI: 37 MRN: [id number] Neck Size: 20.00 <br> <br>  CLINICAL INFORMATION The patient is referred for a BiPAP titration to treat sleep apnea.  Date of  HST: 07/2017, AHI 43/h  SLEEP STUDY TECHNIQUE As per the AASM Manual for the Scoring of Sleep and Associated Events v2.3 (April 2016) with a hypopnea requiring 4% desaturations.  The channels recorded and monitored were frontal, central and occipital EEG, electrooculogram (EOG), submentalis EMG (chin), nasal and oral airflow, thoracic and abdominal wall motion, anterior tibialis EMG, snore microphone, electrocardiogram, and pulse oximetry. Bilevel positive airway pressure (BPAP) was initiated at the beginning of the study and titrated to treat sleep-disordered breathing.  RESPIRATORY PARAMETERS Optimal IPAP Pressure (cm): 29 AHI at Optimal Pressure (/hr) 6.7 Optimal EPAP Pressure (cm): 25   Overall Minimal O2 (%): 60.00 Minimal O2 at Optimal Pressure (%): 84.0   SLEEP ARCHITECTURE Start Time: 10:43:29 PM Stop Time: 4:44:33 AM Total Time (min): 361.1 Total Sleep Time (min): 308.3 Sleep Latency (min): 0.0 Sleep Efficiency (%): 85.4 REM Latency (min): 75.8 WASO (min): 52.7 Stage N1 (%): 11.35 Stage N2 (%): 51.68 Stage N3 (%): 11.51 Stage R (%): 25.46 Supine (%): 99.94 Arousal Index (/hr): 22.0     CARDIAC DATA The 2 lead EKG demonstrated sinus rhythm. The mean heart rate was 71.51 beats per minute. Other EKG findings include: None.   LEG MOVEMENT DATA The total Periodic Limb Movements of Sleep (PLMS) were 2. The PLMS index was 0.39. A PLMS index of <15 is considered normal in adults.  IMPRESSIONS - An optimal PAP pressure was selected for this patient ( 29 /25 cm of water). He did  not tolerate high pressures on CPAP - Central sleep apnea was not noted during this titration (CAI = 4.5/h). - Severe oxygen desaturations were observed during this titration (min O2 = 60.00%). - The patient snored with loud snoring volume. - No cardiac abnormalities were observed during this study. - Clinically significant periodic limb movements were not noted during this study. Arousals associated with PLMs were rare.    DIAGNOSIS - Obstructive Sleep Apnea (327.23 [G47.33 ICD-10])    RECOMMENDATIONS - Trial of BiPAP therapy on 29/25 cm H2O with a Medium size Fisher&Paykel Full Face Mask Simplus mask and heated humidification. Alternatively, auto bilevel can be used. He did not tolerate  high pressures on CPAP - Avoid alcohol, sedatives and other CNS depressants that may worsen sleep apnea and disrupt normal sleep architecture. - Sleep hygiene should be reviewed to assess factors that may improve sleep quality. - Weight management and regular exercise should be initiated or continued. - Return to [facility] Center for re-evaluation after 4 weeks of therapy    Cyril Mourningakesh Priya Matsen MD Board Certified in Sleep medicine

## 2017-09-29 NOTE — Telephone Encounter (Signed)
Spoke with patient. He is aware of RA recs. Wishes to proceed with Bipap machine. Will go ahead and place order.

## 2017-10-04 DIAGNOSIS — G4733 Obstructive sleep apnea (adult) (pediatric): Secondary | ICD-10-CM | POA: Diagnosis not present

## 2017-11-04 DIAGNOSIS — G4733 Obstructive sleep apnea (adult) (pediatric): Secondary | ICD-10-CM | POA: Diagnosis not present

## 2017-12-02 DIAGNOSIS — G4733 Obstructive sleep apnea (adult) (pediatric): Secondary | ICD-10-CM | POA: Diagnosis not present

## 2017-12-03 DIAGNOSIS — J189 Pneumonia, unspecified organism: Secondary | ICD-10-CM | POA: Diagnosis not present

## 2017-12-06 ENCOUNTER — Encounter: Payer: Self-pay | Admitting: Pulmonary Disease

## 2018-02-01 DIAGNOSIS — G4733 Obstructive sleep apnea (adult) (pediatric): Secondary | ICD-10-CM | POA: Diagnosis not present

## 2018-03-04 DIAGNOSIS — G4733 Obstructive sleep apnea (adult) (pediatric): Secondary | ICD-10-CM | POA: Diagnosis not present

## 2018-03-19 ENCOUNTER — Ambulatory Visit: Payer: BLUE CROSS/BLUE SHIELD | Admitting: Family Medicine

## 2018-03-19 ENCOUNTER — Encounter: Payer: Self-pay | Admitting: Family Medicine

## 2018-03-19 ENCOUNTER — Ambulatory Visit (INDEPENDENT_AMBULATORY_CARE_PROVIDER_SITE_OTHER): Payer: BLUE CROSS/BLUE SHIELD

## 2018-03-19 VITALS — BP 124/80 | HR 73 | Ht 73.0 in | Wt 289.4 lb

## 2018-03-19 DIAGNOSIS — M7590 Shoulder lesion, unspecified, unspecified shoulder: Secondary | ICD-10-CM | POA: Insufficient documentation

## 2018-03-19 DIAGNOSIS — M19012 Primary osteoarthritis, left shoulder: Secondary | ICD-10-CM | POA: Diagnosis not present

## 2018-03-19 DIAGNOSIS — M12812 Other specific arthropathies, not elsewhere classified, left shoulder: Secondary | ICD-10-CM | POA: Diagnosis not present

## 2018-03-19 MED ORDER — MELOXICAM 15 MG PO TABS: 15.0000 mg | ORAL_TABLET | Freq: Every day | ORAL | 0 refills | Status: DC

## 2018-03-19 NOTE — Progress Notes (Signed)
Subjective:  Patient ID: Jonathan Harper, male    DOB: 07/06/1981  Age: 37 y.o. MRN: [id number]  CC: left shoulder pain   HPI Nicko Daher presents for long-standing history of intermittent left shoulder pain that seems to be worsening of late.  There has not been a specific injury to his shoulder.  He is right-hand dominant.  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  Pain seems to be located in the right lateral shoulder and does radiate down the arm to some extent.  There is been no weakness or numbness and tingling.  His range of motion is severely led to a time.  He has had difficulty putting his shirt on.  He has woken him up from sleep when he rolls over at night.  He has been taking OTC IBU with some relief.  Outpatient Medications Prior to Visit  Medication Sig Dispense Refill  . esomeprazole (NEXIUM) 20 MG packet Take 20 mg by mouth daily before breakfast.    . amoxicillin-clavulanate (AUGMENTIN) 875-125 MG tablet Take 1 tablet by mouth 2 (two) times daily. 14 tablet 0   No facility-administered medications prior to visit.     ROS Review of Systems  Constitutional: Negative.   Respiratory: Negative.   Cardiovascular: Negative.   Gastrointestinal: Negative.   Musculoskeletal: Positive for arthralgias. Negative for neck pain and neck stiffness.  Skin: Negative.   Allergic/Immunologic: Negative for immunocompromised state.  Neurological: Negative for weakness and numbness.  Hematological: Does not bruise/bleed easily.  Psychiatric/Behavioral: Negative.     Objective:  BP 124/80   Pulse 73   Ht 6\' 1"  (1.854 m)   Wt 289 lb 6 oz (131.3 kg)   SpO2 97%   BMI 38.18 kg/m   BP Readings from Last 3 Encounters:  03/19/18 124/80  09/25/17 126/86  07/11/17 124/76    Wt Readings from Last 3 Encounters:  03/19/18 289 lb 6 oz (131.3 kg)  09/25/17 294 lb 8 oz  (133.6 kg)  09/13/17 284 lb (128.8 kg)    Physical Exam  Constitutional: He is oriented to person, place, and time. He appears well-developed and well-nourished. No distress.  HENT:  Head: Normocephalic and atraumatic.  Right Ear: External ear normal.  Left Ear: External ear normal.  Eyes: Right eye exhibits no discharge. Left eye exhibits no discharge. No scleral icterus.  Pulmonary/Chest: Effort normal.  Musculoskeletal:       Left shoulder: He exhibits decreased range of motion (ttp of sub acromail bursa. ) and tenderness. He exhibits no bony tenderness.       Arms: Neurological: He is alert and oriented to person, place, and time.  Skin: Skin is warm and dry. He is not diaphoretic.  Psychiatric: He has a normal mood and affect. His behavior is normal.    Lab Results  Component Value Date   WBC 6.8 06/12/2017   HGB 14.3 06/12/2017   HCT 43.1 06/12/2017   PLT 278.0 06/12/2017   GLUCOSE 93 06/12/2017   CHOL 158 06/12/2017   TRIG 114.0 06/12/2017   HDL 34.90 (L) 06/12/2017   LDLCALC 100 (H) 06/12/2017   ALT 15 06/12/2017   AST 12 06/12/2017   NA 137 06/12/2017   K 4.1 06/12/2017   CL 101 06/12/2017   CREATININE 0.78 06/12/2017   BUN 10 06/12/2017   CO2 30 06/12/2017   HGBA1C  6.3 06/12/2017    Dg Chest 2 View  Result Date: 01/26/2016 CLINICAL DATA:  Productive cough for 1 month EXAM: CHEST  2 VIEW COMPARISON:  None. FINDINGS: The heart size and mediastinal contours are within normal limits. Both lungs are clear. The visualized skeletal structures are unremarkable. IMPRESSION: No active cardiopulmonary disease. Electronically Signed   By: Alcide CleverMark  Lukens M.D.   On: 01/26/2016 17:10    Assessment & Plan:   Mat CarneClay was seen today for left shoulder pain.  Diagnoses and all orders for this visit:  Rotator cuff arthropathy, left -     DG Shoulder Left; Future -     Ambulatory referral to Sports Medicine -     meloxicam (MOBIC) 15 MG tablet; Take 1 tablet (15 mg total) by  mouth daily. For 14 days and then as needed.   I have discontinued Alcides Rhodes's esomeprazole and amoxicillin-clavulanate. I am also having him start on meloxicam.  Meds ordered this encounter  Medications  . meloxicam (MOBIC) 15 MG tablet    Sig: Take 1 tablet (15 mg total) by mouth daily. For 14 days and then as needed.    Dispense:  30 tablet    Refill:  0   Sports medicine referral for ultrasound evaluation of the rotator cuff and possible injection.  Follow-up: No follow-ups on file.  Mliss SaxWilliam Alfred Kremer, MD

## 2018-03-19 NOTE — Patient Instructions (Signed)
Rotator Cuff Tendinitis Rotator cuff tendinitis is inflammation of the tough, cord-like bands that connect muscle to bone (tendons) in the rotator cuff. The rotator cuff includes all of the muscles and tendons that connect the arm to the shoulder. The rotator cuff holds the head of the upper arm bone (humerus) in the cup (fossa) of the shoulder blade (scapula). This condition can lead to a long-lasting (chronic) tear. The tear may be partial or complete. What are the causes? This condition is usually caused by overusing the rotator cuff. What increases the risk? This condition is more likely to develop in athletes and workers who frequently use their shoulder or reach over their heads. This can include activities such as:  Tennis.  Baseball or softball.  Swimming.  Construction work.  Painting.  What are the signs or symptoms? Symptoms of this condition include:  Pain spreading (radiating) from the shoulder to the upper arm.  Swelling and tenderness in front of the shoulder.  Pain when reaching, pulling, or lifting the arm above the head.  Pain when lowering the arm from above the head.  Minor pain in the shoulder when resting.  Increased pain in the shoulder at night.  Difficulty placing the arm behind the back.  How is this diagnosed? This condition is diagnosed with a medical history and physical exam. Tests may also be done, including:  X-rays.  MRI.  Ultrasounds.  CT or MR arthrogram. During this test, a contrast material is injected and then images are taken.  How is this treated? Treatment for this condition depends on the severity of the condition. In less severe cases, treatment may include:  Rest. This may be done with a sling that holds the shoulder still (immobilization). Your health care provider may also recommend avoiding activities that involve lifting your arm over your head.  Icing the shoulder.  Anti-inflammatory medicines, such as aspirin or  ibuprofen.  In more severe cases, treatment may include:  Physical therapy.  Steroid injections.  Surgery.  Follow these instructions at home: If you have a sling:  Wear the sling as told by your health care provider. Remove it only as told by your health care provider.  Loosen the sling if your fingers tingle, become numb, or turn cold and blue.  Keep the sling clean.  If the sling is not waterproof, do not let it get wet. Remove it, if allowed, or cover it with a watertight covering when you take a bath or shower. Managing pain, stiffness, and swelling  If directed, put ice on the injured area. ? If you have a removable sling, remove it as told by your health care provider. ? Put ice in a plastic bag. ? Place a towel between your skin and the bag. ? Leave the ice on for 20 minutes, 2-3 times a day.  Move your fingers often to avoid stiffness and to lessen swelling.  Raise (elevate) the injured area above the level of your heart while you are lying down.  Find a comfortable sleeping position or sleep on a recliner, if available. Driving  Do not drive or use heavy machinery while taking prescription pain medicine.  Ask your health care provider when it is safe to drive if you have a sling on your arm. Activity  Rest your shoulder as told by your health care provider.  Return to your normal activities as told by your health care provider. Ask your health care provider what activities are safe for you.  Do any   exercises or stretches as told by your health care provider.  If you do repetitive overhead tasks, take small breaks in between and include stretching exercises as told by your health care provider. General instructions  Do not use any products that contain nicotine or tobacco, such as cigarettes and e-cigarettes. These can delay healing. If you need help quitting, ask your health care provider.  Take over-the-counter and prescription medicines only as told by  your health care provider.  Keep all follow-up visits as told by your health care provider. This is important. Contact a health care provider if:  Your pain gets worse.  You have new pain in your arm, hands, or fingers.  Your pain is not relieved with medicine or does not get better after 6 weeks of treatment.  You have cracking sensations when moving your shoulder in certain directions.  You hear a snapping sound after using your shoulder, followed by severe pain and weakness. Get help right away if:  Your arm, hand, or fingers are numb or tingling.  Your arm, hand, or fingers are swollen or painful or they turn white or blue. Summary  Rotator cuff tendinitis is inflammation of the tough, cord-like bands that connect muscle to bone (tendons) in the rotator cuff.  This condition is usually caused by overusing the rotator cuff, which includes all of the muscles and tendons that connect the arm to the shoulder.  This condition is more likely to develop in athletes and workers who frequently use their shoulder or reach over their heads.  Treatment generally includes rest, anti-inflammatory medicines, and icing. In some cases, physical therapy and steroid injections may be needed. In severe cases, surgery may be needed. This information is not intended to replace advice given to you by your health care provider. Make sure you discuss any questions you have with your health care provider. Document Released: 11/19/2003 Document Revised: 08/15/2016 Document Reviewed: 08/15/2016 Elsevier Interactive Patient Education  2017 [employer] Inc[employer]  

## 2018-03-22 ENCOUNTER — Ambulatory Visit (INDEPENDENT_AMBULATORY_CARE_PROVIDER_SITE_OTHER): Payer: BLUE CROSS/BLUE SHIELD

## 2018-03-22 ENCOUNTER — Ambulatory Visit: Payer: BLUE CROSS/BLUE SHIELD | Admitting: Family Medicine

## 2018-03-22 ENCOUNTER — Encounter: Payer: Self-pay | Admitting: Family Medicine

## 2018-03-22 VITALS — BP 140/78 | HR 96 | Ht 73.0 in | Wt 288.0 lb

## 2018-03-22 DIAGNOSIS — M12812 Other specific arthropathies, not elsewhere classified, left shoulder: Secondary | ICD-10-CM | POA: Diagnosis not present

## 2018-03-22 DIAGNOSIS — M7592 Shoulder lesion, unspecified, left shoulder: Secondary | ICD-10-CM

## 2018-03-22 MED ORDER — DICLOFENAC SODIUM 2 % TD SOLN: 1.0000 "application " | Freq: Two times a day (BID) | TRANSDERMAL | 3 refills | Status: DC

## 2018-03-22 NOTE — Patient Instructions (Signed)
Nice to meet you  Please try the rehab exercises  Please try pennsaid  Please follow up in 4 weeks if your pain hasn't improved.

## 2018-03-22 NOTE — Assessment & Plan Note (Addendum)
Likely related to his supraspinatus and mild bursitis. No tear appreciated on US exam. Pain is intermittent at this time.  - pennsaid - can continue mobic  - counseled on HEP  - if no improvement then consider nitro, PT or injection

## 2018-03-22 NOTE — Progress Notes (Signed)
Jonathan Harper - 37 y.o. male MRN [id number]  Date of birth: 1980/11/29  SUBJECTIVE:  Including CC & ROS.  Chief Complaint  Patient presents with  . Left shoulder pain    Jonathan CarasClay Kuiken is a 37 y.o. male that is presenting with left shoulder pain. Pain has been ongoing for one month. Pain is located on the lateral aspect of the shoulder. Some pain down to the elbow. Pain is intermittent, mild to severe with abduction. Pain is triggered raising his arm above his head.  Denies tingling or numbness. Denies injury or trauma. 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 Denies weakness in his arm. He has been taking Mobic.   Independent review of the left shoulder x-ray from 7/8 shows normal exam.    Review of Systems  Constitutional: Negative for fever.  HENT: Negative for congestion.   Respiratory: Negative for cough.   Cardiovascular: Negative for chest pain.  Gastrointestinal: Negative for abdominal pain.  Musculoskeletal: Negative for back pain.  Skin: Negative for color change.  Neurological: Negative for weakness.  Hematological: Negative for adenopathy.  Psychiatric/Behavioral: Negative for agitation.    HISTORY: Past Medical, Surgical, Social, and Family History Reviewed & Updated per EMR.   Pertinent Historical Findings include:  Past Medical History:  Diagnosis Date  . Depression   . History of nephrolithiasis     Past Surgical History:  Procedure Laterality Date  . CHOLECYSTECTOMY    . FOOT SURGERY    . heel spur removal      No Known Allergies  Family History  Problem Relation Age of Onset  . Hypertension Unknown   . Cancer Unknown   . Arthritis Mother   . Arthritis Father      Social History   Socioeconomic History  . Marital status: Single    Spouse name: Not on file  . Number of children: Not on file  . Years of education: Not on file  . Highest education level: Not on file  Occupational History  . Occupation: Set designertechnician    Employer:  [employer]  Social Needs  . Financial resource strain: Not on file  . Food insecurity:    Worry: Not on file    Inability: Not on file  . Transportation needs:    Medical: Not on file    Non-medical: Not on file  Tobacco Use  . Smoking status: Former Smoker    Types: Cigars    Last attempt to quit: 06/11/2017    Years since quitting: 0.7  . Smokeless tobacco: Never Used  . Tobacco comment: May smoke a cigar every month or two  Substance and Sexual Activity  . Alcohol use: Yes    Alcohol/week: 0.0 oz    Comment: occasional  . Drug use: No    Comment: history of MJ,NASAL COCAINE,PILLS  . Sexual activity: Not on file  Lifestyle  . Physical activity:    Days per week: Not on file    Minutes per session: Not on file  . Stress: Not on file  Relationships  . Social connections:    Talks on phone: Not on file    Gets together: Not on file    Attends religious service: Not on file    Active member of club or organization: Not on file    Attends meetings of clubs or organizations: Not on file    Relationship status: Not on file  . Intimate partner violence:    Fear of current or ex partner: Not  on file    Emotionally abused: Not on file    Physically abused: Not on file    Forced sexual activity: Not on file  Other Topics Concern  . 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[place],was in army     PHYSICAL EXAM:  VS: BP 140/78 (BP Location: Left Arm, Patient Position: Sitting, Cuff Size: Normal)   Pulse 96   Ht 6\' 1"  (1.854 m)   Wt 288 lb (130.6 kg)   SpO2 96%   BMI 38.00 kg/m  Physical Exam Gen: NAD, alert, cooperative with exam, well-appearing ENT: normal lips, normal nasal mucosa,  Eye: normal EOM, normal conjunctiva and lids CV:  no edema, +2 pedal pulses   Resp: no accessory muscle use, non-labored, Skin: no rashes, no areas of induration  Neuro: normal tone, normal sensation to touch Psych:  normal insight, alert and  oriented MSK:  Left shoulder:  Normal AROM  No swelling or ecchymosis  Normal ER  Normal ER and abduction  Normal IR and ER  Normal Empty can and Hawkin's testing  Normal compression test  Mild pain with O'brien's testing  Neurovascularly intact    Limited ultrasound: left shoulder:  Normal appearing BT  Normal appearing subscap in static and dynamic testing  Supraspinatus with hypoechoic change to suggest tendinopathy. Mild subacromial bursitis  Normal appearing AC joint  Normal appearing infraspinatus   Summary: supraspinatus tendinopathy and mild subacromial bursitis   Ultrasound and interpretation by Clare Gandy, MD    ASSESSMENT & PLAN:   Supraspinatus tendinitis Likely related to his supraspinatus and mild bursitis. No tear appreciated on US exam. Pain is intermittent at this time.  - pennsaid - can continue mobic  - counseled on HEP  - if no improvement then consider nitro, PT or injection

## 2018-04-03 DIAGNOSIS — G4733 Obstructive sleep apnea (adult) (pediatric): Secondary | ICD-10-CM | POA: Diagnosis not present

## 2018-04-06 DIAGNOSIS — G4733 Obstructive sleep apnea (adult) (pediatric): Secondary | ICD-10-CM | POA: Diagnosis not present

## 2018-10-12 ENCOUNTER — Encounter: Payer: Self-pay | Admitting: Family Medicine

## 2018-10-12 ENCOUNTER — Ambulatory Visit (INDEPENDENT_AMBULATORY_CARE_PROVIDER_SITE_OTHER): Payer: BLUE CROSS/BLUE SHIELD | Admitting: Family Medicine

## 2018-10-12 VITALS — BP 120/82 | HR 78 | Temp 98.7°F | Ht 73.0 in | Wt 286.5 lb

## 2018-10-12 DIAGNOSIS — M79675 Pain in left toe(s): Secondary | ICD-10-CM

## 2018-10-12 MED ORDER — NAPROXEN 500 MG PO TABS: ORAL_TABLET | ORAL | 1 refills | Status: DC

## 2018-10-12 MED ORDER — DICLOFENAC SODIUM 1 % TD GEL: 1.0000 "application " | Freq: Three times a day (TID) | TRANSDERMAL | 1 refills | Status: DC

## 2018-10-12 NOTE — Patient Instructions (Signed)
I think this is a capsulitis of the great toe joint on the left, or possible osteoarthritis (wear and tear arthritis) flare in h/o toe fracture, less likely gout.  Treat with anti inflammatory course naprosyn 500mg  twice daily with meals for 1 week then as needed.  Can also try voltaren gel (prescription) or aspercream OTC topically as needed.  Let us know if not improving with treatment.  Ensure supportive shoeware.

## 2018-10-12 NOTE — Assessment & Plan Note (Signed)
Anticipate 1st MTPJ capsulitis, vs osteoarthritis in prior h/o trauma. Supportive care reviewed, discussed supportive shoe wear, Rx naprosyn PO + voltaren gel topically. Update if not improving, would consider foot xray and podiatry referral. Pt agrees with plan..  Not consistent with gout flare - reviewed symptoms with patient.

## 2018-10-12 NOTE — Progress Notes (Signed)
BP 120/82 (BP Location: Left Arm, Patient Position: Sitting, Cuff Size: Large)   Pulse 78   Temp 98.7 F (37.1 C) (Oral)   Ht 6\' 1"  (1.854 m)   Wt 286 lb 8 oz (130 kg)   SpO2 95%   BMI 37.80 kg/m    CC: L foot pain Subjective:    Patient ID: Jonathan Harper, male    DOB: 01-26-1981, 38 y.o.   MRN: [id number]  HPI: Jonathan Harper is a 38 y.o. male presenting on 10/12/2018 for Foot Pain (C/o left foot pain. Pain is in ball of foot and top of foot. Started about 1 mo ago. )   1 mo h/o intermittent L foot pain without know inciting trauma/injury or falls. Pain at dorsal foot from great toe up ankle. Swelling. Some redness and warmth of foot.    No other joint pains No h/o gout.  No recent diet changes.  Hasn't tried anything for this yet.   Endorses h/o remote great toe fracture - he doesn't remember if R or L side though.     Relevant past medical, surgical, family and social history reviewed and updated as indicated. Interim medical history since our last visit reviewed. Allergies and medications reviewed and updated. Outpatient Medications Prior to Visit  Medication Sig Dispense Refill  . calcium carbonate (TUMS - DOSED IN MG ELEMENTAL CALCIUM) 500 MG chewable tablet Chew 1 tablet by mouth daily.    . Diclofenac Sodium (PENNSAID) 2 % SOLN Place 1 application onto the skin 2 (two) times daily. 1 Bottle 3  . meloxicam (MOBIC) 15 MG tablet Take 1 tablet (15 mg total) by mouth daily. For 14 days and then as needed. 30 tablet 0   No facility-administered medications prior to visit.      Per HPI unless specifically indicated in ROS section below Review of Systems Objective:    BP 120/82 (BP Location: Left Arm, Patient Position: Sitting, Cuff Size: Large)   Pulse 78   Temp 98.7 F (37.1 C) (Oral)   Ht 6\' 1"  (1.854 m)   Wt 286 lb 8 oz (130 kg)   SpO2 95%   BMI 37.80 kg/m   Wt Readings from Last 3 Encounters:  10/12/18 286 lb 8 oz (130 kg)  03/22/18 288 lb (130.6 kg)    03/19/18 289 lb 6 oz (131.3 kg)    Physical Exam Vitals signs and nursing note reviewed.  Constitutional:      Appearance: Normal appearance. He is not ill-appearing.  Musculoskeletal: Normal range of motion.        General: Tenderness present. No swelling.     Comments: 2+ DP bilaterally R foot WNL L foot: No pain to palpation at achilles, no ligament laxity, neg calcaneal squeeze test, no pain at base of 5th MT or at navicular, no pain with axial loading at great toe, or with palpation of MT shafts.  Tender to palpation at 1st MTPJ without obvious swelling, redness, warmth. Tender with flexion at 1st MTPJ.   Neurological:     Mental Status: He is alert.       Assessment & Plan:   Problem List Items Addressed This Visit    Great toe pain, left - Primary    Anticipate 1st MTPJ capsulitis, vs osteoarthritis in prior h/o trauma. Supportive care reviewed, discussed supportive shoe wear, Rx naprosyn PO + voltaren gel topically. Update if not improving, would consider foot xray and podiatry referral. Pt agrees with plan..  Not consistent with gout  flare - reviewed symptoms with patient.           Meds ordered this encounter  Medications  . naproxen (NAPROSYN) 500 MG tablet    Sig: Take one po bid x 1 week then prn pain, take with food    Dispense:  40 tablet    Refill:  1  . diclofenac sodium (VOLTAREN) 1 % GEL    Sig: Apply 1 application topically 3 (three) times daily.    Dispense:  1 Tube    Refill:  1   No orders of the defined types were placed in this encounter.   Follow up plan: No follow-ups on file.  Eustaquio Boyden, MD

## 2018-10-16 MED ORDER — DICLOFENAC SODIUM 1 % TD GEL: 2.0000 g | Freq: Three times a day (TID) | TRANSDERMAL | 1 refills | Status: DC

## 2018-10-16 NOTE — Addendum Note (Signed)
Addended by: Eustaquio BoydenGUTIERREZ, Lizbeth Feijoo on: 10/16/2018 11:48 AM   Modules accepted: Orders

## 2018-10-19 DIAGNOSIS — H52223 Regular astigmatism, bilateral: Secondary | ICD-10-CM | POA: Diagnosis not present

## 2019-05-28 ENCOUNTER — Other Ambulatory Visit: Payer: Self-pay

## 2019-05-28 ENCOUNTER — Encounter: Payer: Self-pay | Admitting: Family Medicine

## 2019-05-28 ENCOUNTER — Ambulatory Visit (INDEPENDENT_AMBULATORY_CARE_PROVIDER_SITE_OTHER): Payer: BC Managed Care – PPO | Admitting: Family Medicine

## 2019-05-28 VITALS — BP 120/80 | HR 106 | Temp 97.6°F | Ht 73.0 in | Wt 299.0 lb

## 2019-05-28 DIAGNOSIS — R7303 Prediabetes: Secondary | ICD-10-CM | POA: Diagnosis not present

## 2019-05-28 DIAGNOSIS — M25561 Pain in right knee: Secondary | ICD-10-CM

## 2019-05-28 DIAGNOSIS — R202 Paresthesia of skin: Secondary | ICD-10-CM | POA: Diagnosis not present

## 2019-05-28 LAB — POCT GLYCOSYLATED HEMOGLOBIN (HGB A1C): Hemoglobin A1C: 5.9 % — AB (ref 4.0–5.6)

## 2019-05-28 NOTE — Assessment & Plan Note (Signed)
Reviewed with pt from labs 2018 - will update A1c in setting of new hand paresthesias.

## 2019-05-28 NOTE — Assessment & Plan Note (Signed)
Exam most consistent with patellofemoral pain syndrome. Discussed with patient. rec ibuprofen 600mg  TID with meals x 1-2 wks, provided with exercises from [facility] pt advisor. Update if not improving with this for referral to PT. Pt agrees with plan.

## 2019-05-28 NOTE — Patient Instructions (Addendum)
Flu shot at work  A1c today.  I think hand numbness may be coming from carpal tunnel syndrome - buy wrist braces for night time use, and during day if helpful. Continue ibuporfen as needed. Pick up b12 or b-complex vitamin and trial for a month. If worsening despite above, let us know for referral to hand surgeon.  For right knee - I think you have patellofemoral pain syndrome or runners knee. Do exercises provided today. Continue ibuprofen 600mg  three times daily with food for 1-2 weeks. If no better with this, let me know for physical therapy.   Carpal Tunnel Syndrome  Carpal tunnel syndrome is a condition that causes pain in your hand and arm. The carpal tunnel is a narrow area located on the palm side of your wrist. Repeated wrist motion or certain diseases may cause swelling within the tunnel. This swelling pinches the main nerve in the wrist (median nerve). What are the causes? This condition may be caused by:  Repeated wrist motions.  Wrist injuries.  Arthritis.  A cyst or tumor in the carpal tunnel.  Fluid buildup during pregnancy. Sometimes the cause of this condition is not known. What increases the risk? The following factors may make you more likely to develop this condition:  Having a job, such as being a Haematologistbutcher or a cashier, that requires you to repeatedly move your wrist in the same motion.  Being a woman.  Having certain conditions, such as: ? Diabetes. ? Obesity. ? An underactive thyroid (hypothyroidism). ? Kidney failure. What are the signs or symptoms? Symptoms of this condition include:  A tingling feeling in your fingers, especially in your thumb, index, and middle fingers.  Tingling or numbness in your hand.  An aching feeling in your entire arm, especially when your wrist and elbow are bent for a long time.  Wrist pain that goes up your arm to your shoulder.  Pain that goes down into your palm or fingers.  A weak feeling in your hands. You may  have trouble grabbing and holding items. Your symptoms may feel worse during the night. How is this diagnosed? This condition is diagnosed with a medical history and physical exam. You may also have tests, including:  Electromyogram (EMG). This test measures electrical signals sent by your nerves into the muscles.  Nerve conduction study. This test measures how well electrical signals pass through your nerves.  Imaging tests, such as X-rays, ultrasound, and MRI. These tests check for possible causes of your condition. How is this treated? This condition may be treated with:  Lifestyle changes. It is important to stop or change the activity that caused your condition.  Doing exercise and activities to strengthen your muscles and bones (physical therapy).  Learning how to use your hand again after diagnosis (occupational therapy).  Medicines for pain and inflammation. This may include medicine that is injected into your wrist.  A wrist splint.  Surgery. Follow these instructions at home: If you have a splint:  Wear the splint as told by your health care provider. Remove it only as told by your health care provider.  Loosen the splint if your fingers tingle, become numb, or turn cold and blue.  Keep the splint clean.  If the splint is not waterproof: ? Do not let it get wet. ? Cover it with a watertight covering when you take a bath or shower. Managing pain, stiffness, and swelling   If directed, put ice on the painful area: ? If you have  a removable splint, remove it as told by your health care provider. ? Put ice in a plastic bag. ? Place a towel between your skin and the bag. ? Leave the ice on for 20 minutes, 2-3 times per day. General instructions  Take over-the-counter and prescription medicines only as told by your health care provider.  Rest your wrist from any activity that may be causing your pain. If your condition is work related, talk with your employer about  changes that can be made, such as getting a wrist pad to use while typing.  Do any exercises as told by your health care provider, physical therapist, or occupational therapist.  Keep all follow-up visits as told by your health care provider. This is important. Contact a health care provider if:  You have new symptoms.  Your pain is not controlled with medicines.  Your symptoms get worse. Get help right away if:  You have severe numbness or tingling in your wrist or hand. Summary  Carpal tunnel syndrome is a condition that causes pain in your hand and arm.  It is usually caused by repeated wrist motions.  Lifestyle changes and medicines are used to treat carpal tunnel syndrome. Surgery may be recommended.  Follow your health care provider's instructions about wearing a splint, resting from activity, keeping follow-up visits, and calling for help. This information is not intended to replace advice given to you by your health care provider. Make sure you discuss any questions you have with your health care provider. Document Released: 08/26/2000 Document Revised: 01/05/2018 Document Reviewed: 01/05/2018 Elsevier Patient Education  2020 [employer].

## 2019-05-28 NOTE — Progress Notes (Signed)
This visit was conducted in person.  BP 120/80 (BP Location: Left Arm, Patient Position: Sitting, Cuff Size: Large)   Pulse (!) 106   Temp 97.6 F (36.4 C) (Tympanic)   Ht 6\' 1"  (1.854 m)   Wt 299 lb (135.6 kg)   SpO2 96%   BMI 39.45 kg/m    CC: R knee pain Subjective:    Patient ID: Jonathan Harper, male    DOB: 1981-08-07, 38 y.o.   MRN: [id number]  HPI: Jonathan KehrClay M Pesqueira is a 38 y.o. male presenting on 05/28/2019 for Knee Pain (C/o right knee pain.  Denies injury.  Started about 2 mos ago, worsened. Pain occurs when using ladder/stairs. ) and Numbness (C/o numbness in bilateral hands.  Started several yrs ago, worsening. )   Several month h/o R knee pain, without inciting trauma/injury. More noticeable with going down stairs - sharp stabbing pain anterior knee at kneecap also notices knee pops a lot. So far has tried ibuprofen with limited relief. No prior knee surgeries or trouble in the past. He was running daily at gym prior to Covid. Pain started several months after he stopped running.   Several year h/o bilateral hand paresthesias and numbness - has forgotten to mention in the past. Noticed more when driving, playing video games, writing, typing on laptop. R>L (right handed). Resting hands improves symptoms. Entire hand, front and back, goes numb. No foot paresthesias. No neck pain or shooting pain down arms.  Alcohol - 5 beers/month.  H/o prediabetes - last A1c 6.3% (2018).      Relevant past medical, surgical, family and social history reviewed and updated as indicated. Interim medical history since our last visit reviewed. Allergies and medications reviewed and updated. Outpatient Medications Prior to Visit  Medication Sig Dispense Refill  . calcium carbonate (TUMS - DOSED IN MG ELEMENTAL CALCIUM) 500 MG chewable tablet Chew 1 tablet by mouth daily.    . diclofenac sodium (VOLTAREN) 1 % GEL Apply 2 g topically 3 (three) times daily. 1 Tube 1  . naproxen (NAPROSYN) 500 MG  tablet Take one po bid x 1 week then prn pain, take with food 40 tablet 1   No facility-administered medications prior to visit.      Per HPI unless specifically indicated in ROS section below Review of Systems Objective:    BP 120/80 (BP Location: Left Arm, Patient Position: Sitting, Cuff Size: Large)   Pulse (!) 106   Temp 97.6 F (36.4 C) (Tympanic)   Ht 6\' 1"  (1.854 m)   Wt 299 lb (135.6 kg)   SpO2 96%   BMI 39.45 kg/m   Wt Readings from Last 3 Encounters:  05/28/19 299 lb (135.6 kg)  10/12/18 286 lb 8 oz (130 kg)  03/22/18 288 lb (130.6 kg)    Physical Exam Vitals signs and nursing note reviewed.  Constitutional:      Appearance: Normal appearance. He is obese. He is not ill-appearing.  Neck:     Musculoskeletal: Normal range of motion and neck supple.     Comments: FROM at neck Musculoskeletal: Normal range of motion.     Comments:  2+ rad pulses bilaterally FROM at wrists  L knee WNL R Knee exam: No deformity on inspection. No significant pain with palpation of knee landmarks. No effusion/swelling noted. FROM in flex/extension without crepitus. No popliteal fullness. Neg drawer test. Neg mcmurray test. No pain with valgus/varus stress. Painful with PFgrind. No abnormal patellar mobility.   Skin:  Findings: No erythema or rash.  Neurological:     General: No focal deficit present.     Mental Status: He is alert.     Comments:  Neg tinel Mild reproducible paresthesias to bilateral thumbs with phalen test       Results for orders placed or performed in visit on 05/28/19  POCT glycosylated hemoglobin (Hb A1C)  Result Value Ref Range   Hemoglobin A1C 5.9 (A) 4.0 - 5.6 %   HbA1c POC (<> result, manual entry)     HbA1c, POC (prediabetic range)     HbA1c, POC (controlled diabetic range)     Assessment & Plan:   Problem List Items Addressed This Visit    Right anterior knee pain - Primary    Exam most consistent with patellofemoral pain syndrome.  Discussed with patient. rec ibuprofen 600mg  TID with meals x 1-2 wks, provided with exercises from [facility] pt advisor. Update if not improving with this for referral to PT. Pt agrees with plan.       Prediabetes    Reviewed with pt from labs 2018 - will update A1c in setting of new hand paresthesias.       Relevant Orders   POCT glycosylated hemoglobin (Hb A1C) (Completed)   Paresthesia of hand, bilateral    Suspicious for CTS, bilateral. Treat with wrist braces, NSAID, trial B12 or B-complex vitamin. Update if not improving with this for consideration of hand surgery eval. Pt agrees with plan.           No orders of the defined types were placed in this encounter.  Orders Placed This Encounter  Procedures  . POCT glycosylated hemoglobin (Hb A1C)     Patient instructions: Flu shot at work  A1c today.  I think hand numbness may be coming from carpal tunnel syndrome - buy wrist braces for night time use, and during day if helpful. Continue ibuporfen as needed. Pick up b12 or b-complex vitamin and trial for a month. If worsening despite above, let us know for referral to hand surgeon.  For right knee - I think you have patellofemoral pain syndrome or runners knee. Do exercises provided today. Continue ibuprofen 600mg  three times daily with food for 1-2 weeks. If no better with this, let me know for physical therapy.   Follow up plan: Return if symptoms worsen or fail to improve.  Ria Bush, MD

## 2019-05-28 NOTE — Assessment & Plan Note (Signed)
Suspicious for CTS, bilateral. Treat with wrist braces, NSAID, trial B12 or B-complex vitamin. Update if not improving with this for consideration of hand surgery eval. Pt agrees with plan.

## 2019-12-09 DIAGNOSIS — M25561 Pain in right knee: Secondary | ICD-10-CM | POA: Diagnosis not present

## 2019-12-17 ENCOUNTER — Encounter: Payer: Self-pay | Admitting: Family Medicine

## 2019-12-17 ENCOUNTER — Other Ambulatory Visit: Payer: BC Managed Care – PPO

## 2019-12-17 ENCOUNTER — Ambulatory Visit (INDEPENDENT_AMBULATORY_CARE_PROVIDER_SITE_OTHER): Payer: BC Managed Care – PPO | Admitting: Family Medicine

## 2019-12-17 ENCOUNTER — Other Ambulatory Visit: Payer: Self-pay

## 2019-12-17 VITALS — Temp 97.7°F | Ht 73.0 in | Wt 295.0 lb

## 2019-12-17 DIAGNOSIS — R05 Cough: Secondary | ICD-10-CM | POA: Diagnosis not present

## 2019-12-17 DIAGNOSIS — B342 Coronavirus infection, unspecified: Secondary | ICD-10-CM | POA: Diagnosis not present

## 2019-12-17 DIAGNOSIS — R059 Cough, unspecified: Secondary | ICD-10-CM

## 2019-12-17 DIAGNOSIS — U071 COVID-19: Secondary | ICD-10-CM | POA: Diagnosis not present

## 2019-12-17 NOTE — Progress Notes (Signed)
VIRTUAL VISIT Due to national recommendations of social distancing due to [place] 19, a virtual visit is felt to be most appropriate for this patient at this time.   I connected with the patient on 12/17/19 at 11:20 AM EDT by virtual telehealth platform and verified that I am speaking with the correct person using two identifiers.   I discussed the limitations, risks, security and privacy concerns of performing an evaluation and management service by  virtual telehealth platform and the availability of in person appointments. I also discussed with the patient that there may be a patient responsible charge related to this service. The patient expressed understanding and agreed to proceed.  Patient location: Home Provider Location: [facility] Participants: Jonathan Harper and Cathie Beams   Chief Complaint  Patient presents with  . Sinus Pressure    Started on Friday evening  . Cough    with brown phelgm  . Generalized Body Aches  . Nasal Congestion  . Sore Throat    History of Present Illness:  39 year old patient of Dr. Lillie Fragmin presents with new onset fatigue in last 5 days.. body aches, sinus pressure, headache, productive cough ( brownish mucus), sore throat.  Mild nausea, no diarrhea.  no change in taste and smells.  He is mild to moderate SOB when up trying to do things.   Temp 99.53F   No sick contacts. No known COVID exposure.  He works on [employer].  Nyquil helps cough at night. Daytime cold med.  COVID complication risk: 2 male and obese.  No asthma or COPD.  COVID 19 screen No recent travel or known exposure to [place]   The importance of social distancing was discussed today.   Review of Systems  Constitutional: Positive for malaise/fatigue. Negative for chills.  HENT: Positive for congestion, sinus pain and sore throat. Negative for ear pain.   Eyes: Negative for discharge.  Respiratory: Positive for shortness of breath and wheezing.    Cardiovascular: Negative for chest pain and claudication.  Gastrointestinal: Positive for nausea. Negative for diarrhea.  Genitourinary: Negative for dysuria.  Musculoskeletal: Negative for back pain.      Past Medical History:  Diagnosis Date  . Depression   . History of nephrolithiasis     reports that he quit smoking about 2 years ago. His smoking use included cigars. He has never used smokeless tobacco. He reports current alcohol use. He reports that he does not use drugs.   Current Outpatient Medications:  .  esomeprazole (NEXIUM) 20 MG capsule, Take 20 mg by mouth daily., Disp: , Rfl:  .  naproxen sodium (ALEVE) 220 MG tablet, Take 220 mg by mouth daily., Disp: , Rfl:    Observations/Objective: Temperature 97.7 F (36.5 C), temperature source Oral, height 6\' 1"  (1.854 m), weight 295 lb (133.8 kg).  Physical Exam  Physical Exam Constitutional:      General: The patient is not in acute distress. Speaking in complete sentences. Pulmonary:     Effort: Pulmonary effort is normal. No respiratory distress.  Neurological:     Mental Status: The patient is alert and oriented to person, place, and time.  Psychiatric:        Mood and Affect: Mood normal.        Behavior: Behavior normal.   Assessment and Plan  Cough  Likely COVID19  infection vs other viral infection. No clear sign of bacterial infection at this time.  Recommend testing .. info on how to obtain testing provided  through MyChart. No SOB.  No red flags/need for ER visit or in-person exam at respiratory clinic at this time..    Pt moderate risk for COVID complications given  obesity and sex If SOB begins symptoms worsening.. have low threshold for in-person exam, if severe shortness of breath ER visit recommended.  Can monitor Oxygen saturation at home with home monitor if able to obtain.  Go to ER if O2 sat < 90% on room air.  Reviewed home care and provided information through MyChart.  Recommended isolation  until test returns. If returns positive 10 days quarantine recommended.  Provided info about prevention of spread of COVID 19.      I discussed the assessment and treatment plan with the patient. The patient was provided an opportunity to ask questions and all were answered. The patient agreed with the plan and demonstrated an understanding of the instructions.   The patient was advised to call back or seek an in-person evaluation if the symptoms worsen or if the condition fails to improve as anticipated.     Kerby Nora, MD

## 2019-12-17 NOTE — Progress Notes (Signed)
Work note written as instructed by Dr. Bedsole and sent to patient's MyChart. 

## 2019-12-17 NOTE — Patient Instructions (Addendum)
How to make an appointment for COVID testing  TEXT: COVID to [phone]  or CALL: [phone] or  ONLINE: [email or web address]  Locations:  [place]: [facility] [address] 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 No clear sign of bacterial infection at this time.  Recommend testing     Pt moderate risk for COVID complications given  obesity and male. If SOB begins symptoms worsening.. have low threshold for in-person exam, if severe shortness of breath ER visit recommended.  Can monitor Oxygen saturation at home with home monitor if able to obtain.  Go to ER if O2 sat < 90% on room air or severe shortness of breath     If your COVID test is POSITIVE you may return to work/school  if all of the following are true: 1.  10 days since symptom onset or positive COVID-19 test 2.    3 consecutive days without fever and without antipyretics 3.    You have symptom improvement [especially respiratory]).  If your COVID test is NEGATIVE you may return to work when you are 24 hour fever free and respiratory symptoms are resolved.

## 2019-12-17 NOTE — Assessment & Plan Note (Signed)
Likely COVID19  infection vs other viral infection. No clear sign of bacterial infection at this time.  Recommend testing .. info on how to obtain testing provided through MyChart. No SOB.  No red flags/need for ER visit or in-person exam at respiratory clinic at this time..    Pt moderate risk for COVID complications given  obesity and sex If SOB begins symptoms worsening.. have low threshold for in-person exam, if severe shortness of breath ER visit recommended.  Can monitor Oxygen saturation at home with home monitor if able to obtain.  Go to ER if O2 sat < 90% on room air.  Reviewed home care and provided information through MyChart.  Recommended isolation until test returns. If returns positive 10 days quarantine recommended.  Provided info about prevention of spread of COVID 19.

## 2019-12-22 ENCOUNTER — Emergency Department: Payer: BC Managed Care – PPO

## 2019-12-22 ENCOUNTER — Encounter: Payer: Self-pay | Admitting: Emergency Medicine

## 2019-12-22 ENCOUNTER — Other Ambulatory Visit: Payer: Self-pay

## 2019-12-22 ENCOUNTER — Inpatient Hospital Stay
Admission: EM | Admit: 2019-12-22 | Discharge: 2019-12-26 | DRG: 177 | Disposition: A | Discharge status: Home / Self Care | Payer: BC Managed Care – PPO | Attending: Internal Medicine | Admitting: Internal Medicine

## 2019-12-22 DIAGNOSIS — K219 Gastro-esophageal reflux disease without esophagitis: Secondary | ICD-10-CM

## 2019-12-22 DIAGNOSIS — G4733 Obstructive sleep apnea (adult) (pediatric): Secondary | ICD-10-CM | POA: Diagnosis not present

## 2019-12-22 DIAGNOSIS — R0602 Shortness of breath: Secondary | ICD-10-CM | POA: Diagnosis not present

## 2019-12-22 DIAGNOSIS — J9601 Acute respiratory failure with hypoxia: Secondary | ICD-10-CM | POA: Diagnosis not present

## 2019-12-22 DIAGNOSIS — R042 Hemoptysis: Secondary | ICD-10-CM | POA: Diagnosis present

## 2019-12-22 DIAGNOSIS — R112 Nausea with vomiting, unspecified: Secondary | ICD-10-CM

## 2019-12-22 DIAGNOSIS — Z7952 Long term (current) use of systemic steroids: Secondary | ICD-10-CM

## 2019-12-22 DIAGNOSIS — U071 COVID-19: Secondary | ICD-10-CM | POA: Diagnosis not present

## 2019-12-22 DIAGNOSIS — Z8261 Family history of arthritis: Secondary | ICD-10-CM | POA: Diagnosis not present

## 2019-12-22 DIAGNOSIS — Z79899 Other long term (current) drug therapy: Secondary | ICD-10-CM | POA: Diagnosis not present

## 2019-12-22 DIAGNOSIS — Z8249 Family history of ischemic heart disease and other diseases of the circulatory system: Secondary | ICD-10-CM | POA: Diagnosis not present

## 2019-12-22 DIAGNOSIS — R509 Fever, unspecified: Secondary | ICD-10-CM | POA: Diagnosis not present

## 2019-12-22 DIAGNOSIS — Z87891 Personal history of nicotine dependence: Secondary | ICD-10-CM | POA: Diagnosis not present

## 2019-12-22 DIAGNOSIS — F329 Major depressive disorder, single episode, unspecified: Secondary | ICD-10-CM | POA: Diagnosis not present

## 2019-12-22 DIAGNOSIS — Z791 Long term (current) use of non-steroidal anti-inflammatories (NSAID): Secondary | ICD-10-CM

## 2019-12-22 DIAGNOSIS — J9600 Acute respiratory failure, unspecified whether with hypoxia or hypercapnia: Secondary | ICD-10-CM | POA: Diagnosis not present

## 2019-12-22 DIAGNOSIS — J1282 Pneumonia due to coronavirus disease 2019: Secondary | ICD-10-CM | POA: Diagnosis not present

## 2019-12-22 DIAGNOSIS — Z87442 Personal history of urinary calculi: Secondary | ICD-10-CM

## 2019-12-22 DIAGNOSIS — A0839 Other viral enteritis: Secondary | ICD-10-CM | POA: Diagnosis not present

## 2019-12-22 DIAGNOSIS — Z9049 Acquired absence of other specified parts of digestive tract: Secondary | ICD-10-CM | POA: Diagnosis not present

## 2019-12-22 DIAGNOSIS — R1110 Vomiting, unspecified: Secondary | ICD-10-CM

## 2019-12-22 HISTORY — DX: COVID-19: U07.1

## 2019-12-22 LAB — COMPREHENSIVE METABOLIC PANEL
ALT: 22 U/L (ref 0–44)
AST: 13 U/L — ABNORMAL LOW (ref 15–41)
Albumin: 3.2 g/dL — ABNORMAL LOW (ref 3.5–5.0)
Alkaline Phosphatase: 74 U/L (ref 38–126)
Anion gap: 10 (ref 5–15)
BUN: 14 mg/dL (ref 6–20)
CO2: 24 mmol/L (ref 22–32)
Calcium: 8.5 mg/dL — ABNORMAL LOW (ref 8.9–10.3)
Chloride: 101 mmol/L (ref 98–111)
Creatinine, Ser: 0.75 mg/dL (ref 0.61–1.24)
GFR calc Af Amer: >60 mL/min (ref >60)
GFR calc non Af Amer: >60 mL/min (ref >60)
Glucose, Bld: 121 mg/dL — ABNORMAL HIGH (ref 70–99)
Potassium: 3.6 mmol/L (ref 3.5–5.1)
Sodium: 135 mmol/L (ref 135–145)
Total Bilirubin: 0.5 mg/dL (ref 0.3–1.2)
Total Protein: 7.7 g/dL (ref 6.5–8.1)

## 2019-12-22 LAB — URINALYSIS, COMPLETE (UACMP) WITH MICROSCOPIC
Bacteria, UA: NONE SEEN
Bilirubin Urine: NEGATIVE
Glucose, UA: NEGATIVE mg/dL
Hgb urine dipstick: NEGATIVE
Ketones, ur: 20 mg/dL — AB
Leukocytes,Ua: NEGATIVE
Nitrite: NEGATIVE
Protein, ur: 30 mg/dL — AB
Specific Gravity, Urine: 1.038 — ABNORMAL HIGH (ref 1.005–1.030)
Squamous Epithelial / HPF: NONE SEEN (ref 0–5)
pH: 6 (ref 5.0–8.0)

## 2019-12-22 LAB — CBC WITH DIFFERENTIAL/PLATELET
Abs Immature Granulocytes: 0.02 10*3/uL (ref 0.00–0.07)
Basophils Absolute: 0 10*3/uL (ref 0.0–0.1)
Basophils Relative: 0 %
Eosinophils Absolute: 0 10*3/uL (ref 0.0–0.5)
Eosinophils Relative: 0 %
HCT: 41.7 % (ref 39.0–52.0)
Hemoglobin: 13.7 g/dL (ref 13.0–17.0)
Immature Granulocytes: 0 %
Lymphocytes Relative: 9 %
Lymphs Abs: 0.7 10*3/uL (ref 0.7–4.0)
MCH: 29.6 pg (ref 26.0–34.0)
MCHC: 32.9 g/dL (ref 30.0–36.0)
MCV: 90.1 fL (ref 80.0–100.0)
Monocytes Absolute: 0.7 10*3/uL (ref 0.1–1.0)
Monocytes Relative: 8 %
Neutro Abs: 6.7 10*3/uL (ref 1.7–7.7)
Neutrophils Relative %: 83 %
Platelets: 200 10*3/uL (ref 150–400)
RBC: 4.63 MIL/uL (ref 4.22–5.81)
RDW: 13.3 % (ref 11.5–15.5)
WBC: 8.1 10*3/uL (ref 4.0–10.5)
nRBC: 0 % (ref 0.0–0.2)

## 2019-12-22 LAB — RESPIRATORY PANEL BY RT PCR (FLU A&B, COVID)
Influenza A by PCR: NEGATIVE
Influenza B by PCR: NEGATIVE
SARS Coronavirus 2 by RT PCR: POSITIVE — AB

## 2019-12-22 LAB — PROTIME-INR
INR: 1.1 (ref 0.8–1.2)
Prothrombin Time: 13.8 seconds (ref 11.4–15.2)

## 2019-12-22 LAB — LACTIC ACID, PLASMA
Lactic Acid, Venous: 1 mmol/L (ref 0.5–1.9)
Lactic Acid, Venous: 2.8 mmol/L (ref 0.5–1.9)

## 2019-12-22 LAB — ABO/RH: ABO/RH(D): O POS

## 2019-12-22 LAB — PROCALCITONIN: Procalcitonin: 0.11 ng/mL

## 2019-12-22 MED ORDER — ONDANSETRON HCL 4 MG/2ML IJ SOLN: 4.0000 mg | Freq: Four times a day (QID) | INTRAMUSCULAR | Status: DC | PRN

## 2019-12-22 MED ORDER — ALBUTEROL SULFATE HFA 108 (90 BASE) MCG/ACT IN AERS
4.0000 | INHALATION_SPRAY | Freq: Once | RESPIRATORY_TRACT | Status: AC
  Administered 2019-12-22: 4 via RESPIRATORY_TRACT
  Filled 2019-12-22: qty 6.7

## 2019-12-22 MED ORDER — IOHEXOL 350 MG/ML SOLN
100.0000 mL | Freq: Once | INTRAVENOUS | Status: AC | PRN
  Administered 2019-12-22: 100 mL via INTRAVENOUS

## 2019-12-22 MED ORDER — DEXAMETHASONE SODIUM PHOSPHATE 10 MG/ML IJ SOLN
10.0000 mg | Freq: Once | INTRAMUSCULAR | Status: AC
  Administered 2019-12-22: 10 mg via INTRAVENOUS
  Filled 2019-12-22: qty 1

## 2019-12-22 MED ORDER — ACETAMINOPHEN 500 MG PO TABS
1000.0000 mg | ORAL_TABLET | Freq: Once | ORAL | Status: AC
  Administered 2019-12-22: 1000 mg via ORAL
  Filled 2019-12-22: qty 2

## 2019-12-22 MED ORDER — ENOXAPARIN SODIUM 40 MG/0.4ML ~~LOC~~ SOLN
40.0000 mg | SUBCUTANEOUS | Status: DC
  Administered 2019-12-23 – 2019-12-25 (×4): 40 mg via SUBCUTANEOUS
  Filled 2019-12-22 (×4): qty 0.4

## 2019-12-22 MED ORDER — SODIUM CHLORIDE 0.9 % IV SOLN
100.0000 mg | Freq: Every day | INTRAVENOUS | Status: AC
  Administered 2019-12-23 – 2019-12-26 (×4): 100 mg via INTRAVENOUS
  Filled 2019-12-22 (×4): qty 100

## 2019-12-22 MED ORDER — ACETAMINOPHEN 325 MG PO TABS
650.0000 mg | ORAL_TABLET | Freq: Four times a day (QID) | ORAL | Status: DC | PRN
  Filled 2019-12-22: qty 2

## 2019-12-22 MED ORDER — LACTATED RINGERS IV BOLUS
1000.0000 mL | Freq: Once | INTRAVENOUS | Status: AC
  Administered 2019-12-22: 1000 mL via INTRAVENOUS

## 2019-12-22 MED ORDER — SODIUM CHLORIDE 0.9 % IV SOLN
200.0000 mg | Freq: Once | INTRAVENOUS | Status: AC
  Administered 2019-12-23: 01:00:00 200 mg via INTRAVENOUS
  Filled 2019-12-22: qty 40

## 2019-12-22 MED ORDER — SODIUM CHLORIDE 0.9 % IV SOLN: INTRAVENOUS | Status: DC

## 2019-12-22 MED ORDER — ONDANSETRON HCL 4 MG/2ML IJ SOLN
4.0000 mg | Freq: Once | INTRAMUSCULAR | Status: AC
  Administered 2019-12-22: 4 mg via INTRAVENOUS
  Filled 2019-12-22: qty 2

## 2019-12-22 MED ORDER — DEXAMETHASONE SODIUM PHOSPHATE 10 MG/ML IJ SOLN
6.0000 mg | INTRAMUSCULAR | Status: DC
  Administered 2019-12-23 – 2019-12-25 (×3): 6 mg via INTRAVENOUS
  Filled 2019-12-22 (×3): qty 1

## 2019-12-22 MED ORDER — PANTOPRAZOLE SODIUM 40 MG PO TBEC
40.0000 mg | DELAYED_RELEASE_TABLET | Freq: Every day | ORAL | Status: DC
  Administered 2019-12-23 – 2019-12-26 (×4): 40 mg via ORAL
  Filled 2019-12-22 (×4): qty 1

## 2019-12-22 NOTE — ED Triage Notes (Addendum)
Pt arrived via POV mask in place with reports of being sick x 10 days, pt states off and on he has felt short of breath.  Pt states he was dx with COVID-19 on Tuesday 4/6, c/o cough with bloody sputum, vomiting, fever, dehydration.  Pt states tmax at home 101.6.

## 2019-12-22 NOTE — Consult Note (Signed)
Remdesivir - Pharmacy Brief Note   A/P:  Pt states he was dx with COVID-19 on Tuesday 4/6  Remdesivir 200 mg IVPB once followed by 100 mg IVPB daily x 4 days.   Cephus Shelling, PharmD Clinical Pharmacist 12/22/2019 9:13 PM

## 2019-12-22 NOTE — ED Provider Notes (Signed)
[facility] Emergency Department Provider Note   ____________________________________________   First MD Initiated Contact with Patient 12/22/19 1554     (approximate)  I have reviewed the triage vital signs and the nursing notes.   HISTORY  Chief Complaint Shortness of Breath and COVID-19 Positive    HPI Jonathan Harper is a 39 y.o. male with past medical history of depression and GERD who presents to the ED complaining of shortness of breath.  Patient reports that he has been feeling weak and having fevers for the past 5 to 6 days.  He initially tested positive for COVID-19 at his PCPs office on a rapid test.  Since then, he has been on steroids and using albuterol, but continues to feel worse.  He complains of cough and hemoptysis as well as ongoing difficulty breathing and persistent nausea with vomiting.  He has not had any diarrhea, but states it has been difficult for him to keep anything down.        Past Medical History:  Diagnosis Date  . COVID-19   . Depression   . History of nephrolithiasis     Patient Active Problem List   Diagnosis Date Noted  . Acute respiratory failure due to COVID-19 (HCC) 12/22/2019  . GERD (gastroesophageal reflux disease) 12/22/2019  . Prediabetes 05/28/2019  . Right anterior knee pain 05/28/2019  . Paresthesia of hand, bilateral 05/28/2019  . Great toe pain, left 10/12/2018  . Supraspinatus tendinitis 03/19/2018  . OSA (obstructive sleep apnea) 07/11/2017  . Cough 02/03/2016  . Sciatica 09/16/2013  . NEPHROLITHIASIS, HX OF 01/08/2009  . DEPRESSION 01/07/2009    Past Surgical History:  Procedure Laterality Date  . CHOLECYSTECTOMY    . FOOT SURGERY    . heel spur removal      Prior to Admission medications   Medication Sig Start Date End Date Taking? Authorizing Provider  azithromycin (ZITHROMAX) 250 MG tablet Take 250 mg by mouth as directed. 12/17/19  Yes [provider]  esomeprazole  (NEXIUM) 20 MG capsule Take 20 mg by mouth daily.   Yes [provider]  naproxen sodium (ALEVE) 220 MG tablet Take 220 mg by mouth daily.   Yes [provider]  predniSONE (DELTASONE) 20 MG tablet Take 20 mg by mouth 2 (two) times daily. 12/17/19  Yes [provider]  albuterol (VENTOLIN HFA) 108 (90 Base) MCG/ACT inhaler  12/17/19   [provider]  benzonatate (TESSALON PERLES) 100 MG capsule Take 1 capsule (100 mg total) by mouth 3 (three) times daily as needed for cough. 01/26/16 02/02/16  Allegra Grana, FNP    Allergies Patient has no known allergies.  Family History  Problem Relation Age of Onset  . Hypertension Other   . Cancer Other   . Arthritis Mother   . Arthritis Father     Social History Social History   Tobacco Use  . Smoking status: Former Smoker    Types: Cigars    Quit date: 06/11/2017    Years since quitting: 2.5  . Smokeless tobacco: Never Used  . Tobacco comment: May smoke a cigar every month or two  Substance Use Topics  . Alcohol use: Yes    Alcohol/week: 0.0 standard drinks    Comment: occasional  . Drug use: No    Comment: history of MJ,NASAL COCAINE,PILLS    Review of Systems  Constitutional: Positive for fever/chills Eyes: No visual changes. ENT: No sore throat. Cardiovascular: Denies chest pain. Respiratory: Positive for  cough and shortness of breath.  Positive for hemoptysis. Gastrointestinal: No abdominal pain.  Positive for nausea and vomiting.  No diarrhea.  No constipation. Genitourinary: Negative for dysuria. Musculoskeletal: Negative for back pain. Skin: Negative for rash. Neurological: Negative for headaches, focal weakness or numbness.  ____________________________________________   PHYSICAL EXAM:  VITAL SIGNS: ED Triage Vitals  Enc Vitals Group     BP 12/22/19 1442 (!) 142/93     Pulse Rate 12/22/19 1442 (!) 112     Resp 12/22/19 1442 (!) 40     Temp 12/22/19 1442 (!) 101.1 F (38.4 C)      Temp Source 12/22/19 1442 Oral     SpO2 12/22/19 1442 92 %     Weight 12/22/19 1443 295 lb (133.8 kg)     Height 12/22/19 1443 6\' 1"  (1.854 m)     Head Circumference --      Peak Flow --      Pain Score 12/22/19 1442 8     Pain Loc --      Pain Edu? --      Excl. in GC? --     Constitutional: Alert and oriented. Eyes: Conjunctivae are normal. Head: Atraumatic. Nose: No congestion/rhinnorhea. Mouth/Throat: Mucous membranes are dry. Neck: Normal ROM Cardiovascular: Tachycardic, regular rhythm. Grossly normal heart sounds. Respiratory: Tachypneic with increased respiratory effort.  No retractions. Lungs CTAB. Gastrointestinal: Soft and nontender. No distention. Genitourinary: deferred Musculoskeletal: No lower extremity tenderness nor edema. Neurologic:  Normal speech and language. No gross focal neurologic deficits are appreciated. Skin:  Skin is warm, dry and intact. No rash noted. Psychiatric: Mood and affect are normal. Speech and behavior are normal.  ____________________________________________   LABS (all labs ordered are listed, but only abnormal results are displayed)  Labs Reviewed  RESPIRATORY PANEL BY RT PCR (FLU A&B, COVID) - Abnormal; Notable for the following components:      Result Value   SARS Coronavirus 2 by RT PCR POSITIVE (*)    All other components within normal limits  COMPREHENSIVE METABOLIC PANEL - Abnormal; Notable for the following components:   Glucose, Bld 121 (*)    Calcium 8.5 (*)    Albumin 3.2 (*)    AST 13 (*)    All other components within normal limits  LACTIC ACID, PLASMA - Abnormal; Notable for the following components:   Lactic Acid, Venous 2.8 (*)    All other components within normal limits  URINALYSIS, COMPLETE (UACMP) WITH MICROSCOPIC - Abnormal; Notable for the following components:   Color, Urine YELLOW (*)    APPearance CLEAR (*)    Specific Gravity, Urine 1.038 (*)    Ketones, ur 20 (*)    Protein, ur 30 (*)    All  other components within normal limits  CULTURE, BLOOD (ROUTINE X 2)  CULTURE, BLOOD (ROUTINE X 2)  LACTIC ACID, PLASMA  CBC WITH DIFFERENTIAL/PLATELET  PROTIME-INR  PROCALCITONIN  HIV ANTIBODY (ROUTINE TESTING W REFLEX)  ABO/RH   ____________________________________________  EKG  ED ECG REPORT I, 02/21/20, the attending physician, personally viewed and interpreted this ECG.   Date: 12/22/2019  EKG Time: 16:07  Rate: 92  Rhythm: normal sinus rhythm  Axis: Normal  Intervals:none  ST&T Change: None   PROCEDURES  Procedure(s) performed (including Critical Care):  Procedures   ____________________________________________   INITIAL IMPRESSION / ASSESSMENT AND PLAN / ED COURSE       39 year old male with no significant past medical history presents to the ED complaining of increasing  weakness, fevers, hemoptysis, shortness of breath, and vomiting since testing positive for COVID-19 last week.  He is not in any respiratory distress but is significantly tachypneic initially with a respiratory rate in the 40s.  While his O2 sats were low normal at 92%, he was placed on supplemental oxygen to assist with his tachypnea.  We will treat with albuterol and steroids given oxygen requirement, chest x-ray appears consistent with COVID-19.  He is also febrile but lab work is thus far not consistent with sepsis and there does not appear to be a bacterial etiology for infection.  Lactate is within normal limits and he does not have a leukocytosis.  Remainder of labs are also unremarkable.  Given likely dehydration, we will hydrate with IV fluids and give dose of IV Zofran.  EKG shows no evidence of arrhythmia or ischemia.  We will need to further assess for PE with CTA given his hemoptysis.  CTA is negative for PE but does have findings consistent with COVID-19 pneumonia.  Patient was given additional dose of steroids and states breathing is per improved following albuterol.  He remained  stable on 2 L nasal cannula and case was discussed with hospitalist for admission.      ____________________________________________   FINAL CLINICAL IMPRESSION(S) / ED DIAGNOSES  Final diagnoses:  Pneumonia due to COVID-19 virus  Non-intractable vomiting with nausea, unspecified vomiting type  Shortness of breath     ED Discharge Orders    None       Note:  This document was prepared using Dragon voice recognition software and may include unintentional dictation errors.   Blake Divine, MD 12/23/19 [phone]

## 2019-12-22 NOTE — Plan of Care (Signed)
  Problem: Education: Goal: Knowledge of risk factors and measures for prevention of condition will improve Outcome: Progressing   

## 2019-12-22 NOTE — ED Notes (Signed)
RED TOP TUBE AND 2 SST tubes sent to lab

## 2019-12-22 NOTE — ED Notes (Signed)
Pt states feeling weak, having fevers, and coughing up some blood recently. Pt also states having some n/v/d

## 2019-12-22 NOTE — H&P (Signed)
History and Physical    Jonathan Harper GYI:[id number] DOB: Jun 03, 1981 DOA: 12/22/2019  PCP: Hannah Beat, MD  Patient coming from: Home  I have personally briefly reviewed patient's old medical records in [facility]  Chief Complaint: Fever, shortness of breath  HPI: Jonathan Harper is a 39 y.o. male with medical history significant for OSA not on CPAP, pre-diabetes, and GERD who presents with persistent fever and shortness of breath.   He was diagnosed with COVID on 4/6 by PCP.He was started on prednisone and Azithromycin. Since then has been having daily fever up to 101.Frequent cough productive of sputum and has hemoptysis. Also having frequent vomiting and some diarrhea. Unable to keep much down. Has shortness of breath worse with exertion. No chest pain. No abdominal pain. No LE edema.   ED Course: He had fever of 101.82F with hypoxia down to 87% requiring up to 4L. No leukocytosis. Glucose of 121 otherwise other electrolytes normal. Lactate acid of 2.8. PCT of 0.11.  CTA shows no PE but had multifocal pneumonia with trace bilateral pleural effusions.   Review of System: Constitutional: No Weight Change, + Fever ENT/Mouth: No sore throat, No Rhinorrhea Eyes: No Eye Pain, No Vision Changes Cardiovascular: No Chest Pain,+ SOB Respiratory: + Cough, + Sputum, No Wheezing, + Dyspnea  Gastrointestinal: + Nausea, + Vomiting, + Diarrhea, No Constipation, No Pain Genitourinary: no Urinary Incontinence, No Urgency, No Flank Pain Musculoskeletal: No Arthralgias, No Myalgias Skin: No Skin Lesions, No Pruritus, Neuro: no Weakness, No Numbness,  No Loss of Consciousness, No Syncope Psych: No Anxiety/Panic, No Depression, + decrease appetite Heme/Lymph: No Bruising, No Bleeding   Past Medical History:  Diagnosis Date  . COVID-19   . Depression   . History of nephrolithiasis     Past Surgical History:  Procedure Laterality Date  . CHOLECYSTECTOMY    . FOOT SURGERY    . heel  spur removal       reports that he quit smoking about 2 years ago. His smoking use included cigars. He has never used smokeless tobacco. He reports current alcohol use. He reports that he does not use drugs.  No Known Allergies  Family History  Problem Relation Age of Onset  . Hypertension Other   . Cancer Other   . Arthritis Mother   . Arthritis Father      Prior to Admission medications   Medication Sig Start Date End Date Taking? Authorizing Provider  azithromycin (ZITHROMAX) 250 MG tablet Take 250 mg by mouth as directed. 12/17/19  Yes [provider]  esomeprazole (NEXIUM) 20 MG capsule Take 20 mg by mouth daily.   Yes [provider]  naproxen sodium (ALEVE) 220 MG tablet Take 220 mg by mouth daily.   Yes [provider]  predniSONE (DELTASONE) 20 MG tablet Take 20 mg by mouth 2 (two) times daily. 12/17/19  Yes [provider]  albuterol (VENTOLIN HFA) 108 (90 Base) MCG/ACT inhaler  12/17/19   [provider]  benzonatate (TESSALON PERLES) 100 MG capsule Take 1 capsule (100 mg total) by mouth 3 (three) times daily as needed for cough. 01/26/16 02/02/16  Allegra Grana, FNP    Physical Exam: Vitals:   12/22/19 2030 12/22/19 2100 12/22/19 2130 12/22/19 2200  BP: 126/85 (!) 139/91 136/89 (!) 141/92  Pulse: 79 78 91 88  Resp: 19 17 18 16   Temp:      TempSrc:      SpO2: (!) 86% 96% 93% 94%  Weight:      Height:        Constitutional: ill-appearing and diaphoretic male with bed sheet soaked in sweat laying flat in bed  Vitals:   12/22/19 2030 12/22/19 2100 12/22/19 2130 12/22/19 2200  BP: 126/85 (!) 139/91 136/89 (!) 141/92  Pulse: 79 78 91 88  Resp: 19 17 18 16   Temp:      TempSrc:      SpO2: (!) 86% 96% 93% 94%  Weight:      Height:       Eyes: PERRL, lids and conjunctivae normal ENMT: Mucous membranes are moist.  Neck: normal, supple Respiratory: faint bibasilar crackles. Normal respiratory effort on 4L. No accessory  muscle use.  Cardiovascular: Regular rate and rhythm, no murmurs / rubs / gallops. No extremity edema. 2+ pedal pulses.   Abdomen: no tenderness, no masses palpated.  Bowel sounds positive.  Musculoskeletal: no clubbing / cyanosis. No joint deformity upper and lower extremities. Good ROM, no contractures. Normal muscle tone.  Skin: no rashes, lesions, ulcers. No induration Neurologic: CN 2-12 grossly intact. Sensation intact. Strength 5/5 in all 4.  Psychiatric: Normal judgment and insight. Alert and oriented x 3. Normal mood.     Labs on Admission: I have personally reviewed following labs and imaging studies  CBC: Recent Labs  Lab 12/22/19 1456  WBC 8.1  NEUTROABS 6.7  HGB 13.7  HCT 41.7  MCV 90.1  PLT 200   Basic Metabolic Panel: Recent Labs  Lab 12/22/19 1456  NA 135  K 3.6  CL 101  CO2 24  GLUCOSE 121*  BUN 14  CREATININE 0.75  CALCIUM 8.5*   GFR: Estimated Creatinine Clearance: 178 mL/min (by C-G formula based on SCr of 0.75 mg/dL). Liver Function Tests: Recent Labs  Lab 12/22/19 1456  AST 13*  ALT 22  ALKPHOS 74  BILITOT 0.5  PROT 7.7  ALBUMIN 3.2*   No results for input(s): LIPASE, AMYLASE in the last 168 hours. No results for input(s): AMMONIA in the last 168 hours. Coagulation Profile: Recent Labs  Lab 12/22/19 1456  INR 1.1   Cardiac Enzymes: No results for input(s): CKTOTAL, CKMB, CKMBINDEX, TROPONINI in the last 168 hours. BNP (last 3 results) No results for input(s): PROBNP in the last 8760 hours. HbA1C: No results for input(s): HGBA1C in the last 72 hours. CBG: No results for input(s): GLUCAP in the last 168 hours. Lipid Profile: No results for input(s): CHOL, HDL, LDLCALC, TRIG, CHOLHDL, LDLDIRECT in the last 72 hours. Thyroid Function Tests: No results for input(s): TSH, T4TOTAL, FREET4, T3FREE, THYROIDAB in the last 72 hours. Anemia Panel: No results for input(s): VITAMINB12, FOLATE, FERRITIN, TIBC, IRON, RETICCTPCT in the last  72 hours. Urine analysis:    Component Value Date/Time   COLORURINE YELLOW (A) 12/22/2019 2107   APPEARANCEUR CLEAR (A) 12/22/2019 2107   LABSPEC 1.038 (H) 12/22/2019 2107   PHURINE 6.0 12/22/2019 2107   GLUCOSEU NEGATIVE 12/22/2019 2107   HGBUR NEGATIVE 12/22/2019 2107   BILIRUBINUR NEGATIVE 12/22/2019 2107   KETONESUR 20 (A) 12/22/2019 2107   PROTEINUR 30 (A) 12/22/2019 2107   UROBILINOGEN 1.0 04/04/2011 1847   NITRITE NEGATIVE 12/22/2019 2107   LEUKOCYTESUR NEGATIVE 12/22/2019 2107    Radiological Exams on Admission: CT Angio Chest PE W/Cm &/Or Wo Cm  Result Date: 12/22/2019 CLINICAL DATA:  Shortness of breath, cough, COVID positive. Evaluate for PE. EXAM: CT ANGIOGRAPHY CHEST WITH CONTRAST TECHNIQUE: Multidetector CT imaging of the chest was performed using the standard  protocol during bolus administration of intravenous contrast. Multiplanar CT image reconstructions and MIPs were obtained to evaluate the vascular anatomy. CONTRAST:  161mL OMNIPAQUE IOHEXOL 350 MG/ML SOLN COMPARISON:  Chest radiograph dated 12/22/2019 FINDINGS: Cardiovascular: Suboptimal contrast opacification of the bilateral pulmonary arteries due to bolus timing and respiratory motion. No evidence of central pulmonary embolism. No evidence of thoracic aortic aneurysm or dissection. The heart is normal in size.  No pericardial effusion. Mediastinum/Nodes: No suspicious mediastinal lymphadenopathy. Visualized thyroid is unremarkable. Lungs/Pleura: Multifocal subpleural patchy opacities in the lungs bilaterally, lower lobe predominant, likely reflecting multifocal pneumonia in this patient with known COVID. Trace bilateral pleural effusions. No suspicious pulmonary nodules, although evaluation is limited. No pneumothorax. Upper Abdomen: Visualized upper abdomen is grossly unremarkable. Musculoskeletal: Visualized osseous structures are within normal limits. Review of the MIP images confirms the above findings. IMPRESSION: No  evidence of central pulmonary embolism. Multifocal pneumonia in this patient with known COVID. Trace bilateral pleural effusions. Electronically Signed   By: Julian Hy M.D.   On: 12/22/2019 18:08   DG Chest Portable 1 View  Result Date: 12/22/2019 CLINICAL DATA:  Shortness of breath and fever.  COVID-19 positive. EXAM: PORTABLE CHEST 1 VIEW COMPARISON:  01/26/2016 FINDINGS: Lungs are hypoinflated with mild patchy bilateral opacification left worse than right suggesting multifocal pneumonia. No effusion. Cardiomediastinal silhouette and remainder of the exam is unchanged. IMPRESSION: Patchy opacification over the mid to lower lungs left worse than right suggesting multifocal pneumonia. Electronically Signed   By: Marin Olp M.D.   On: 12/22/2019 16:02    EKG: Independently reviewed.   Assessment/Plan  Acute hypoxic respiratory failure secondary to COVID pneumonia On 4L  Maintain O2 > 92%  IV Decadron  Remdesivir Monitor inflammatory markers  GERD Continue PPI   DVT prophylaxis:.Lovenox Code Status: Full Family Communication: Plan discussed with patient at bedside  disposition Plan: Home with at least 2 midnight stays  Consults called:  Admission status: inpatient     Tyyonna Soucy T Dredyn Gubbels DO [facility]   If 7PM-7AM, please contact night-coverage [email or web address]   12/22/2019, 10:51 PM

## 2019-12-22 NOTE — ED Notes (Signed)
Pt transferred to CT.

## 2019-12-23 DIAGNOSIS — R112 Nausea with vomiting, unspecified: Secondary | ICD-10-CM | POA: Diagnosis not present

## 2019-12-23 DIAGNOSIS — K219 Gastro-esophageal reflux disease without esophagitis: Secondary | ICD-10-CM | POA: Diagnosis not present

## 2019-12-23 DIAGNOSIS — R0602 Shortness of breath: Secondary | ICD-10-CM | POA: Diagnosis not present

## 2019-12-23 DIAGNOSIS — U071 COVID-19: Principal | ICD-10-CM

## 2019-12-23 DIAGNOSIS — J1282 Pneumonia due to coronavirus disease 2019: Secondary | ICD-10-CM | POA: Diagnosis not present

## 2019-12-23 DIAGNOSIS — J9600 Acute respiratory failure, unspecified whether with hypoxia or hypercapnia: Secondary | ICD-10-CM

## 2019-12-23 LAB — HIV ANTIBODY (ROUTINE TESTING W REFLEX): HIV Screen 4th Generation wRfx: NONREACTIVE

## 2019-12-23 MED ORDER — ADULT MULTIVITAMIN W/MINERALS CH
1.0000 | ORAL_TABLET | Freq: Every day | ORAL | Status: DC
  Administered 2019-12-24 – 2019-12-26 (×3): 1 via ORAL
  Filled 2019-12-23 (×3): qty 1

## 2019-12-23 MED ORDER — ENSURE ENLIVE PO LIQD
237.0000 mL | Freq: Three times a day (TID) | ORAL | Status: DC
  Administered 2019-12-23 – 2019-12-26 (×9): 237 mL via ORAL

## 2019-12-23 NOTE — Progress Notes (Signed)
Initial Nutrition Assessment  DOCUMENTATION CODES:   Obesity unspecified  INTERVENTION:   Ensure Enlive po TID, each supplement provides 350 kcal and 20 grams of protein  MVI daily   Liberalize diet   NUTRITION DIAGNOSIS:   Increased nutrient needs related to catabolic illness(COVID 19) as evidenced by increased estimated needs.  GOAL:   Patient will meet greater than or equal to 90% of their needs  MONITOR:   PO intake, Supplement acceptance, Labs, Weight trends, Skin, I & O's  REASON FOR ASSESSMENT:   Malnutrition Screening Tool    ASSESSMENT:   39 y.o. male with medical history significant for OSA not on CPAP, pre-diabetes, and GERD who presents with persistent fever and shortness of breath. He was diagnosed with COVID on 4/6 by PCP.   Spoke with pt via phone. Pt reports poor appetite and oral intake for several days pta as well as a 15lb weight loss. Pt reports that his appetite is improved today and that he ate pretty well for breakfast and lunch. Per chart, pt is down 14lbs(5%) over the past week; this is significant. RD discussed with patient the importance of adequate nutrition needed to preserve lean muscle. Pt is willing to try chocolate supplements. RD will add supplements and liberalize pt's diet to help him meet his estimated needs.    Medications reviewed and include: dexamethasone, lovenox, protonix  Labs reviewed:   NUTRITION - FOCUSED PHYSICAL EXAM: Unable to perform at this time as pt with COVID 19  Diet Order:   Diet Order            Diet regular Room service appropriate? Yes; Fluid consistency: Thin  Diet effective now             EDUCATION NEEDS:   Education needs have been addressed  Skin:  Skin Assessment: Reviewed RN Assessment  Last BM:  pta  Height:   Ht Readings from Last 1 Encounters:  12/22/19 6' 1.5" (1.867 m)    Weight:   Wt Readings from Last 1 Encounters:  12/22/19 127.5 kg    Ideal Body Weight:  86.3 kg  BMI:   Body mass index is 36.58 kg/m.  Estimated Nutritional Needs:   Kcal:  3000-3300kcal/day  Protein:  >140g/day  Fluid:  >2.6L/day  Betsey Holiday MS, RD, LDN Please refer to [facility] for RD and/or RD on-call/weekend/after hours pager

## 2019-12-23 NOTE — Progress Notes (Signed)
Alerted Dr. Sherryll Burger that patient's BP is 161/101. Has fluids going, suggested stopping them. MD agrees and orders to stop fluids, other than that MD states that since patient is asymptomatic that that BP is fine for now.

## 2019-12-23 NOTE — Progress Notes (Signed)
1        [place] [facility]   PATIENT NAME: Jonathan Harper    MR#:  [id number]  DATE OF BIRTH:  05/13/1981  SUBJECTIVE:  CHIEF COMPLAINT:   Chief Complaint  Patient presents with  . Shortness of Breath  . COVID-19 Positive  Short of breath, hypoxic REVIEW OF SYSTEMS:  Review of Systems  Constitutional: Negative for diaphoresis, fever, malaise/fatigue and weight loss.  HENT: Negative for ear discharge, ear pain, hearing loss, nosebleeds, sore throat and tinnitus.   Eyes: Negative for blurred vision and pain.  Respiratory: Positive for shortness of breath. Negative for cough, hemoptysis and wheezing.   Cardiovascular: Negative for chest pain, palpitations, orthopnea and leg swelling.  Gastrointestinal: Negative for abdominal pain, blood in stool, constipation, diarrhea, heartburn, nausea and vomiting.  Genitourinary: Negative for dysuria, frequency and urgency.  Musculoskeletal: Negative for back pain and myalgias.  Skin: Negative for itching and rash.  Neurological: Negative for dizziness, tingling, tremors, focal weakness, seizures, weakness and headaches.  Psychiatric/Behavioral: Negative for depression. The patient is not nervous/anxious.    DRUG ALLERGIES:  No Known Allergies VITALS:  Blood pressure (!) 154/94, pulse 72, temperature 98.5 F (36.9 C), temperature source Oral, resp. rate 17, height 6' 1.5" (1.867 m), weight 127.5 kg, SpO2 95 %. PHYSICAL EXAMINATION:  Physical Exam HENT:     Head: Normocephalic and atraumatic.  Eyes:     Conjunctiva/sclera: Conjunctivae normal.     Pupils: Pupils are equal, round, and reactive to light.  Neck:     Thyroid: No thyromegaly.     Trachea: No tracheal deviation.  Cardiovascular:     Rate and Rhythm: Normal rate and regular rhythm.     Heart sounds: Normal heart sounds.  Pulmonary:     Effort: Pulmonary effort is normal. No respiratory distress.     Breath sounds: Normal breath sounds. No wheezing.  Chest:       Chest wall: No tenderness.  Abdominal:     General: Bowel sounds are normal. There is no distension.     Palpations: Abdomen is soft.     Tenderness: There is no abdominal tenderness.  Musculoskeletal:        General: Normal range of motion.     Cervical back: Normal range of motion and neck supple.  Skin:    General: Skin is warm and dry.     Findings: No rash.  Neurological:     Mental Status: He is alert and oriented to person, place, and time.     Cranial Nerves: No cranial nerve deficit.    LABORATORY PANEL:  Male CBC Recent Labs  Lab 12/22/19 1456  WBC 8.1  HGB 13.7  HCT 41.7  PLT 200   ------------------------------------------------------------------------------------------------------------------ Chemistries  Recent Labs  Lab 12/22/19 1456  NA 135  K 3.6  CL 101  CO2 24  GLUCOSE 121*  BUN 14  CREATININE 0.75  CALCIUM 8.5*  AST 13*  ALT 22  ALKPHOS 74  BILITOT 0.5   RADIOLOGY:  CT Angio Chest PE W/Cm &/Or Wo Cm  Result Date: 12/22/2019 CLINICAL DATA:  Shortness of breath, cough, COVID positive. Evaluate for PE. EXAM: CT ANGIOGRAPHY CHEST WITH CONTRAST TECHNIQUE: Multidetector CT imaging of the chest was performed using the standard protocol during bolus administration of intravenous contrast. Multiplanar CT image reconstructions and MIPs were obtained to evaluate the vascular anatomy. CONTRAST:  OMNIPAQUE IOHEXOL 350 MG/ML SOLN COMPARISON:  Chest radiograph dated 12/22/2019 FINDINGS: Cardiovascular: Suboptimal contrast  opacification of the bilateral pulmonary arteries due to bolus timing and respiratory motion. No evidence of central pulmonary embolism. No evidence of thoracic aortic aneurysm or dissection. The heart is normal in size.  No pericardial effusion. Mediastinum/Nodes: No suspicious mediastinal lymphadenopathy. Visualized thyroid is unremarkable. Lungs/Pleura: Multifocal subpleural patchy opacities in the lungs bilaterally, lower lobe  predominant, likely reflecting multifocal pneumonia in this patient with known COVID. Trace bilateral pleural effusions. No suspicious pulmonary nodules, although evaluation is limited. No pneumothorax. Upper Abdomen: Visualized upper abdomen is grossly unremarkable. Musculoskeletal: Visualized osseous structures are within normal limits. Review of the MIP images confirms the above findings. IMPRESSION: No evidence of central pulmonary embolism. Multifocal pneumonia in this patient with known COVID. Trace bilateral pleural effusions. Electronically Signed   By: Julian Hy M.D.   On: 12/22/2019 18:08   DG Chest Portable 1 View  Result Date: 12/22/2019 CLINICAL DATA:  Shortness of breath and fever.  COVID-19 positive. EXAM: PORTABLE CHEST 1 VIEW COMPARISON:  01/26/2016 FINDINGS: Lungs are hypoinflated with mild patchy bilateral opacification left worse than right suggesting multifocal pneumonia. No effusion. Cardiomediastinal silhouette and remainder of the exam is unchanged. IMPRESSION: Patchy opacification over the mid to lower lungs left worse than right suggesting multifocal pneumonia. Electronically Signed   By: Marin Olp M.D.   On: 12/22/2019 16:02   ASSESSMENT AND PLAN:  Jonathan Harper is a 39 y.o. male with medical history significant for OSA not on CPAP, pre-diabetes, and GERD who presents with persistent fever and shortness of breath. He was diagnosed with COVID on 4/6 by PCP.He was started on prednisone and Azithromycin. Since then has been having daily fever up to 101.Frequent cough productive of sputum and has hemoptysis. Also having frequent vomiting and some diarrhea. Unable to keep much down. Has shortness of breath worse with exertion. No chest pain. No abdominal pain. No LE edema.   ED Course: He had fever of 101.48F with hypoxia down to 87% requiring up to 4L. No leukocytosis. Glucose of 121 otherwise other electrolytes normal. Lactate acid of 2.8. PCT of 0.11. CTA shows no PE  but had multifocal pneumonia with trace bilateral pleural effusions  Acute hypoxic respiratory failure secondary to COVID pneumonia On 3.5L  Maintain O2 > 92%  IV Decadron day 2/10 Remdesivir day 2/5 Monitor inflammatory markers  GERD Continue PPI    Status is: Inpatient  Remains inpatient appropriate because:Inpatient level of care appropriate due to severity of illness   Dispo: The patient is from: Home              Anticipated d/c is to: Home              Anticipated d/c date is: 3 days              Patient currently is not medically stable to d/c.    DVT prophylaxis: Lovenox Family Communication: discussed with patient   All the records are reviewed and case discussed with Care Management/Social Worker. Management plans discussed with the patient, nursing and they are in agreement.  CODE STATUS: Full Code  TOTAL TIME TAKING CARE OF THIS PATIENT: 35 minutes.   More than 50% of the time was spent in counseling/coordination of care: YES  POSSIBLE D/C IN 3-4 DAYS, DEPENDING ON CLINICAL CONDITION.   Max Sane M.D on 12/23/2019 at 1:15 PM  [facility]   CC: Primary care physician; Owens Loffler, MD  Note: This dictation was prepared with Dragon dictation along with smaller phrase  technology. Any transcriptional errors that result from this process are unintentional.

## 2019-12-23 NOTE — Plan of Care (Signed)
  Problem: Education: Goal: Knowledge of risk factors and measures for prevention of condition will improve Outcome: Progressing   Problem: Coping: Goal: Psychosocial and spiritual needs will be supported Outcome: Progressing   

## 2019-12-24 MED ORDER — FUROSEMIDE 10 MG/ML IJ SOLN
40.0000 mg | Freq: Once | INTRAMUSCULAR | Status: AC
  Administered 2019-12-24: 16:00:00 40 mg via INTRAVENOUS
  Filled 2019-12-24: qty 4

## 2019-12-24 NOTE — Progress Notes (Addendum)
Alerted Dr. Sherryll Burger that patient's BP was elevated to 177/89. One time dose of lasix had been given as ordered right before that BP, suggested that lasix may help BP. BP down to 147/92 after about an hour. MD acknowledged and is aware.

## 2019-12-24 NOTE — Progress Notes (Signed)
Alerted Dr. Sherryll Burger that patient's bp is elevated at 159/105. Md acknowledged and no new orders. Will continue to monitor.

## 2019-12-24 NOTE — Progress Notes (Signed)
SATURATION QUALIFICATIONS: (This note is used to comply with regulatory documentation for home oxygen)  Patient Saturations on Room Air at Rest = 96%  Patient Saturations on Room Air while Ambulating = 91%  Patient Saturations on 0 Liters of oxygen while Ambulating = 90%  Please briefly explain why patient needs home oxygen: does not qualify

## 2019-12-24 NOTE — Progress Notes (Signed)
1        [place] [facility]   PATIENT NAME: Jonathan Harper    MR#:  [id number]  DATE OF BIRTH:  1981/03/25  SUBJECTIVE:  CHIEF COMPLAINT:   Chief Complaint  Patient presents with  . Shortness of Breath  . COVID-19 Positive  Shortness of breath improving, hypoxic -on 3 L oxygen REVIEW OF SYSTEMS:  Review of Systems  Constitutional: Negative for diaphoresis, fever, malaise/fatigue and weight loss.  HENT: Negative for ear discharge, ear pain, hearing loss, nosebleeds, sore throat and tinnitus.   Eyes: Negative for blurred vision and pain.  Respiratory: Positive for shortness of breath. Negative for cough, hemoptysis and wheezing.   Cardiovascular: Negative for chest pain, palpitations, orthopnea and leg swelling.  Gastrointestinal: Negative for abdominal pain, blood in stool, constipation, diarrhea, heartburn, nausea and vomiting.  Genitourinary: Negative for dysuria, frequency and urgency.  Musculoskeletal: Negative for back pain and myalgias.  Skin: Negative for itching and rash.  Neurological: Negative for dizziness, tingling, tremors, focal weakness, seizures, weakness and headaches.  Psychiatric/Behavioral: Negative for depression. The patient is not nervous/anxious.    DRUG ALLERGIES:  No Known Allergies VITALS:  Blood pressure (!) 155/89, pulse 77, temperature 98.7 F (37.1 C), temperature source Oral, resp. rate 16, height 6' 1.5" (1.867 m), weight 127.5 kg, SpO2 92 %. PHYSICAL EXAMINATION:  Physical Exam HENT:     Head: Normocephalic and atraumatic.  Eyes:     Conjunctiva/sclera: Conjunctivae normal.     Pupils: Pupils are equal, round, and reactive to light.  Neck:     Thyroid: No thyromegaly.     Trachea: No tracheal deviation.  Cardiovascular:     Rate and Rhythm: Normal rate and regular rhythm.     Heart sounds: Normal heart sounds.  Pulmonary:     Effort: Pulmonary effort is normal. No respiratory distress.     Breath sounds: Normal breath  sounds. No wheezing.  Chest:     Chest wall: No tenderness.  Abdominal:     General: Bowel sounds are normal. There is no distension.     Palpations: Abdomen is soft.     Tenderness: There is no abdominal tenderness.  Musculoskeletal:        General: Normal range of motion.     Cervical back: Normal range of motion and neck supple.  Skin:    General: Skin is warm and dry.     Findings: No rash.  Neurological:     Mental Status: He is alert and oriented to person, place, and time.     Cranial Nerves: No cranial nerve deficit.    LABORATORY PANEL:  Male CBC Recent Labs  Lab 12/22/19 1456  WBC 8.1  HGB 13.7  HCT 41.7  PLT 200   ------------------------------------------------------------------------------------------------------------------ Chemistries  Recent Labs  Lab 12/22/19 1456  NA 135  K 3.6  CL 101  CO2 24  GLUCOSE 121*  BUN 14  CREATININE 0.75  CALCIUM 8.5*  AST 13*  ALT 22  ALKPHOS 74  BILITOT 0.5   RADIOLOGY:  No results found. ASSESSMENT AND PLAN:  Jonathan Harper is a 39 y.o. male with medical history significant for OSA not on CPAP, pre-diabetes, and GERD who presents with persistent fever and shortness of breath. He was diagnosed with COVID on 4/6 by PCP.He was started on prednisone and Azithromycin. Since then has been having daily fever up to 101.Frequent cough productive of sputum and has hemoptysis. Also having frequent vomiting and some diarrhea. Unable  to keep much down. Has shortness of breath worse with exertion. No chest pain. No abdominal pain. No LE edema.   ED Course: He had fever of 101.1F with hypoxia down to 87% requiring up to 4L. No leukocytosis. Glucose of 121 otherwise other electrolytes normal. Lactate acid of 2.8. PCT of 0.11. CTA shows no PE but had multifocal pneumonia with trace bilateral pleural effusions  Acute hypoxic respiratory failure secondary to COVID pneumonia On 3.5L  Maintain O2 > 92%  IV Decadron day  3/10 Remdesivir day 3/5 Monitor inflammatory markers  GERD Continue PPI    Status is: Inpatient  Remains inpatient appropriate because:Inpatient level of care appropriate due to severity of illness   Dispo: The patient is from: Home              Anticipated d/c is to: Home              Anticipated d/c date is: 2 days              Patient currently is not medically stable to d/c.    DVT prophylaxis: Lovenox Family Communication: discussed with patient   All the records are reviewed and case discussed with Care Management/Social Worker. Management plans discussed with the patient, nursing and they are in agreement.  CODE STATUS: Full Code  TOTAL TIME TAKING CARE OF THIS PATIENT: 35 minutes.   More than 50% of the time was spent in counseling/coordination of care: YES  POSSIBLE D/C IN 2-3 DAYS, DEPENDING ON CLINICAL CONDITION.   Max Sane M.D on 12/24/2019 at 2:52 PM  [facility]   CC: Primary care physician; Owens Loffler, MD  Note: This dictation was prepared with Dragon dictation along with smaller phrase technology. Any transcriptional errors that result from this process are unintentional.

## 2019-12-25 NOTE — Progress Notes (Signed)
1        [place] at [place] NAME: Jonathan Harper    MR#:  [id number]  DATE OF BIRTH:  21-Jul-1981  SUBJECTIVE:  CHIEF COMPLAINT:   Chief Complaint  Patient presents with  . Shortness of Breath  . COVID-19 Positive  Shortness of breath improving, hypoxic -on RA now REVIEW OF SYSTEMS:  Review of Systems  Constitutional: Negative for diaphoresis, fever, malaise/fatigue and weight loss.  HENT: Negative for ear discharge, ear pain, hearing loss, nosebleeds, sore throat and tinnitus.   Eyes: Negative for blurred vision and pain.  Respiratory: Positive for shortness of breath. Negative for cough, hemoptysis and wheezing.   Cardiovascular: Negative for chest pain, palpitations, orthopnea and leg swelling.  Gastrointestinal: Negative for abdominal pain, blood in stool, constipation, diarrhea, heartburn, nausea and vomiting.  Genitourinary: Negative for dysuria, frequency and urgency.  Musculoskeletal: Negative for back pain and myalgias.  Skin: Negative for itching and rash.  Neurological: Negative for dizziness, tingling, tremors, focal weakness, seizures, weakness and headaches.  Psychiatric/Behavioral: Negative for depression. The patient is not nervous/anxious.    DRUG ALLERGIES:  No Known Allergies VITALS:  Blood pressure (!) 149/92, pulse 77, temperature 97.8 F (36.6 C), temperature source Oral, resp. rate 16, height 6' 1.5" (1.867 m), weight 127.5 kg, SpO2 96 %. PHYSICAL EXAMINATION:  Physical Exam HENT:     Head: Normocephalic and atraumatic.  Eyes:     Conjunctiva/sclera: Conjunctivae normal.     Pupils: Pupils are equal, round, and reactive to light.  Neck:     Thyroid: No thyromegaly.     Trachea: No tracheal deviation.  Cardiovascular:     Rate and Rhythm: Normal rate and regular rhythm.     Heart sounds: Normal heart sounds.  Pulmonary:     Effort: Pulmonary effort is normal. No respiratory distress.     Breath sounds: Normal breath  sounds. No wheezing.  Chest:     Chest wall: No tenderness.  Abdominal:     General: Bowel sounds are normal. There is no distension.     Palpations: Abdomen is soft.     Tenderness: There is no abdominal tenderness.  Musculoskeletal:        General: Normal range of motion.     Cervical back: Normal range of motion and neck supple.  Skin:    General: Skin is warm and dry.     Findings: No rash.  Neurological:     Mental Status: He is alert and oriented to person, place, and time.     Cranial Nerves: No cranial nerve deficit.    LABORATORY PANEL:  Male CBC Recent Labs  Lab 12/22/19 1456  WBC 8.1  HGB 13.7  HCT 41.7  PLT 200   ------------------------------------------------------------------------------------------------------------------ Chemistries  Recent Labs  Lab 12/22/19 1456  NA 135  K 3.6  CL 101  CO2 24  GLUCOSE 121*  BUN 14  CREATININE 0.75  CALCIUM 8.5*  AST 13*  ALT 22  ALKPHOS 74  BILITOT 0.5   RADIOLOGY:  No results found. ASSESSMENT AND PLAN:  [name] is a 39 y.o. male with medical history significant for OSA not on CPAP, pre-diabetes, and GERD who presents with persistent fever and shortness of breath. He was diagnosed with COVID on 4/6 by PCP.He was started on prednisone and Azithromycin. Since then has been having daily fever up to 101.Frequent cough productive of sputum and has hemoptysis. Also having frequent vomiting and some diarrhea. Unable to  keep much down. Has shortness of breath worse with exertion. No chest pain. No abdominal pain. No LE edema.   ED Course: He had fever of 101.10F with hypoxia down to 87% requiring up to 4L. No leukocytosis. Glucose of 121 otherwise other electrolytes normal. Lactate acid of 2.8. PCT of 0.11. CTA shows no PE but had multifocal pneumonia with trace bilateral pleural effusions  Acute hypoxic respiratory failure secondary to COVID pneumonia On RA now  Maintain O2 > 92%  IV Decadron day  4/10 Remdesivir day 4/5 Monitor inflammatory markers  GERD Continue PPI    Status is: Inpatient  Remains inpatient appropriate because:Inpatient level of care appropriate due to severity of illness   Dispo: The patient is from: Home              Anticipated d/c is to: Home              Anticipated d/c date is: 1 day              Patient currently is not medically stable to d/c.    DVT prophylaxis: Lovenox Family Communication: discussed with patient   All the records are reviewed and case discussed with Care Management/Social Worker. Management plans discussed with the patient, nursing and they are in agreement.  CODE STATUS: Full Code  TOTAL TIME TAKING CARE OF THIS PATIENT: 35 minutes.   More than 50% of the time was spent in counseling/coordination of care: YES  POSSIBLE D/C IN 1 DAYS, DEPENDING ON CLINICAL CONDITION.   Delfino Lovett M.D on 12/25/2019 at 1:33 PM  [facility]   CC: Primary care physician; Hannah Beat, MD  Note: This dictation was prepared with Dragon dictation along with smaller phrase technology. Any transcriptional errors that result from this process are unintentional.

## 2019-12-26 DIAGNOSIS — R0602 Shortness of breath: Secondary | ICD-10-CM

## 2019-12-26 DIAGNOSIS — R112 Nausea with vomiting, unspecified: Secondary | ICD-10-CM

## 2019-12-26 DIAGNOSIS — J1282 Pneumonia due to coronavirus disease 2019: Secondary | ICD-10-CM

## 2019-12-26 DIAGNOSIS — R1110 Vomiting, unspecified: Secondary | ICD-10-CM

## 2019-12-26 MED ORDER — ZINC SULFATE 220 (50 ZN) MG PO CAPS: 220.0000 mg | ORAL_CAPSULE | Freq: Two times a day (BID) | ORAL | 0 refills | Status: DC

## 2019-12-26 MED ORDER — VITAMIN C 250 MG PO TABS: 250.0000 mg | ORAL_TABLET | Freq: Every day | ORAL | 0 refills | Status: DC

## 2019-12-26 NOTE — Discharge Instructions (Signed)
COVID-19 COVID-19 is a respiratory infection that is caused by a virus called severe acute respiratory syndrome coronavirus 2 (SARS-CoV-2). The disease is also known as coronavirus disease or novel coronavirus. In some people, the virus may not cause any symptoms. In others, it may cause a serious infection. The infection can get worse quickly and can lead to complications, such as:  Pneumonia, or infection of the lungs.  Acute respiratory distress syndrome or ARDS. This is a condition in which fluid build-up in the lungs prevents the lungs from filling with air and passing oxygen into the blood.  Acute respiratory failure. This is a condition in which there is not enough oxygen passing from the lungs to the body or when carbon dioxide is not passing from the lungs out of the body.  Sepsis or septic shock. This is a serious bodily reaction to an infection.  Blood clotting problems.  Secondary infections due to bacteria or fungus.  Organ failure. This is when your body's organs stop working. The virus that causes COVID-19 is contagious. This means that it can spread from person to person through droplets from coughs and sneezes (respiratory secretions). What are the causes? This illness is caused by a virus. You may catch the virus by:  Breathing in droplets from an infected person. Droplets can be spread by a person breathing, speaking, singing, coughing, or sneezing.  Touching something, like a table or a doorknob, that was exposed to the virus (contaminated) and then touching your mouth, nose, or eyes. What increases the risk? Risk for infection You are more likely to be infected with this virus if you:  Are within 6 feet (2 meters) of a person with COVID-19.  Provide care for or live with a person who is infected with COVID-19.  Spend time in crowded indoor spaces or live in shared housing. Risk for serious illness You are more likely to become seriously ill from the virus if  you:  Are 50 years of age or older. The higher your age, the more you are at risk for serious illness.  Live in a nursing home or long-term care facility.  Have cancer.  Have a long-term (chronic) disease such as: ? Chronic lung disease, including chronic obstructive pulmonary disease or asthma. ? A long-term disease that lowers your body's ability to fight infection (immunocompromised). ? Heart disease, including heart failure, a condition in which the arteries that lead to the heart become narrow or blocked (coronary artery disease), a disease which makes the heart muscle thick, weak, or stiff (cardiomyopathy). ? Diabetes. ? Chronic kidney disease. ? Sickle cell disease, a condition in which red blood cells have an abnormal "sickle" shape. ? Liver disease.  Are obese. What are the signs or symptoms? Symptoms of this condition can range from mild to severe. Symptoms may appear any time from 2 to 14 days after being exposed to the virus. They include:  A fever or chills.  A cough.  Difficulty breathing.  Headaches, body aches, or muscle aches.  Runny or stuffy (congested) nose.  A sore throat.  New loss of taste or smell. Some people may also have stomach problems, such as nausea, vomiting, or diarrhea. Other people may not have any symptoms of COVID-19. How is this diagnosed? This condition may be diagnosed based on:  Your signs and symptoms, especially if: ? You live in an area with a COVID-19 outbreak. ? You recently traveled to or from an area where the virus is common. ? You   provide care for or live with a person who was diagnosed with COVID-19. ? You were exposed to a person who was diagnosed with COVID-19.  A physical exam.  Lab tests, which may include: ? Taking a sample of fluid from the back of your nose and throat (nasopharyngeal fluid), your nose, or your throat using a swab. ? A sample of mucus from your lungs (sputum). ? Blood tests.  Imaging tests,  which may include, X-rays, CT scan, or ultrasound. How is this treated? At present, there is no medicine to treat COVID-19. Medicines that treat other diseases are being used on a trial basis to see if they are effective against COVID-19. Your health care provider will talk with you about ways to treat your symptoms. For most people, the infection is mild and can be managed at home with rest, fluids, and over-the-counter medicines. Treatment for a serious infection usually takes places in a hospital intensive care unit (ICU). It may include one or more of the following treatments. These treatments are given until your symptoms improve.  Receiving fluids and medicines through an IV.  Supplemental oxygen. Extra oxygen is given through a tube in the nose, a face mask, or a hood.  Positioning you to lie on your stomach (prone position). This makes it easier for oxygen to get into the lungs.  Continuous positive airway pressure (CPAP) or bi-level positive airway pressure (BPAP) machine. This treatment uses mild air pressure to keep the airways open. A tube that is connected to a motor delivers oxygen to the body.  Ventilator. This treatment moves air into and out of the lungs by using a tube that is placed in your windpipe.  Tracheostomy. This is a procedure to create a hole in the neck so that a breathing tube can be inserted.  Extracorporeal membrane oxygenation (ECMO). This procedure gives the lungs a chance to recover by taking over the functions of the heart and lungs. It supplies oxygen to the body and removes carbon dioxide. Follow these instructions at home: Lifestyle  If you are sick, stay home except to get medical care. Your health care provider will tell you how long to stay home. Call your health care provider before you go for medical care.  Rest at home as told by your health care provider.  Do not use any products that contain nicotine or tobacco, such as cigarettes,  e-cigarettes, and chewing tobacco. If you need help quitting, ask your health care provider.  Return to your normal activities as told by your health care provider. Ask your health care provider what activities are safe for you. General instructions  Take over-the-counter and prescription medicines only as told by your health care provider.  Drink enough fluid to keep your urine pale yellow.  Keep all follow-up visits as told by your health care provider. This is important. How is this prevented?  There is no vaccine to help prevent COVID-19 infection. However, there are steps you can take to protect yourself and others from this virus. To protect yourself:   Do not travel to areas where COVID-19 is a risk. The areas where COVID-19 is reported change often. To identify high-risk areas and travel restrictions, check the CDC travel website: wwwnc.cdc.gov/travel/notices  If you live in, or must travel to, an area where COVID-19 is a risk, take precautions to avoid infection. ? Stay away from people who are sick. ? Wash your hands often with soap and water for 20 seconds. If soap and water   are not available, use an alcohol-based hand sanitizer. ? Avoid touching your mouth, face, eyes, or nose. ? Avoid going out in public, follow guidance from your state and local health authorities. ? If you must go out in public, wear a cloth face covering or face mask. Make sure your mask covers your nose and mouth. ? Avoid crowded indoor spaces. Stay at least 6 feet (2 meters) away from others. ? Disinfect objects and surfaces that are frequently touched every day. This may include:  Counters and tables.  Doorknobs and light switches.  Sinks and faucets.  Electronics, such as phones, remote controls, keyboards, computers, and tablets. To protect others: If you have symptoms of COVID-19, take steps to prevent the virus from spreading to others.  If you think you have a COVID-19 infection, contact  your health care provider right away. Tell your health care team that you think you may have a COVID-19 infection.  Stay home. Leave your house only to seek medical care. Do not use public transport.  Do not travel while you are sick.  Wash your hands often with soap and water for 20 seconds. If soap and water are not available, use alcohol-based hand sanitizer.  Stay away from other members of your household. Let healthy household members care for children and pets, if possible. If you have to care for children or pets, wash your hands often and wear a mask. If possible, stay in your own room, separate from others. Use a different bathroom.  Make sure that all people in your household wash their hands well and often.  Cough or sneeze into a tissue or your sleeve or elbow. Do not cough or sneeze into your hand or into the air.  Wear a cloth face covering or face mask. Make sure your mask covers your nose and mouth. Where to find more information  Centers for Disease Control and Prevention: www.cdc.gov/coronavirus/2019-ncov/index.html  World Health Organization: www.who.int/health-topics/coronavirus Contact a health care provider if:  You live in or have traveled to an area where COVID-19 is a risk and you have symptoms of the infection.  You have had contact with someone who has COVID-19 and you have symptoms of the infection. Get help right away if:  You have trouble breathing.  You have pain or pressure in your chest.  You have confusion.  You have bluish lips and fingernails.  You have difficulty waking from sleep.  You have symptoms that get worse. These symptoms may represent a serious problem that is an emergency. Do not wait to see if the symptoms will go away. Get medical help right away. Call your local emergency services (911 in the [place] Do not drive yourself to the hospital. Let the emergency medical personnel know if you think you have  COVID-19. Summary  COVID-19 is a respiratory infection that is caused by a virus. It is also known as coronavirus disease or novel coronavirus. It can cause serious infections, such as pneumonia, acute respiratory distress syndrome, acute respiratory failure, or sepsis.  The virus that causes COVID-19 is contagious. This means that it can spread from person to person through droplets from breathing, speaking, singing, coughing, or sneezing.  You are more likely to develop a serious illness if you are 50 years of age or older, have a weak immune system, live in a nursing home, or have chronic disease.  There is no medicine to treat COVID-19. Your health care provider will talk with you about ways to treat your symptoms.    Take steps to protect yourself and others from infection. Wash your hands often and disinfect objects and surfaces that are frequently touched every day. Stay away from people who are sick and wear a mask if you are sick. This information is not intended to replace advice given to you by your health care provider. Make sure you discuss any questions you have with your health care provider. Document Revised: 06/28/2019 Document Reviewed: 10/04/2018 Elsevier Patient Education  2020 [place].  COVID-19: How to Protect Yourself and Others Know how it spreads  There is currently no vaccine to prevent coronavirus disease 2019 (COVID-19).  The best way to prevent illness is to avoid being exposed to this virus.  The virus is thought to spread mainly from person-to-person. ? Between people who are in close contact with one another (within about 6 feet). ? Through respiratory droplets produced when an infected person coughs, sneezes or talks. ? These droplets can land in the mouths or noses of people who are nearby or possibly be inhaled into the lungs. ? COVID-19 may be spread by people who are not showing symptoms. Everyone should Clean your hands often  Wash your hands  often with soap and water for at least 20 seconds especially after you have been in a public place, or after blowing your nose, coughing, or sneezing.  If soap and water are not readily available, use a hand sanitizer that contains at least 60% alcohol. Cover all surfaces of your hands and rub them together until they feel dry.  Avoid touching your eyes, nose, and mouth with unwashed hands. Avoid close contact  Limit contact with others as much as possible.  Avoid close contact with people who are sick.  Put distance between yourself and other people. ? Remember that some people without symptoms may be able to spread virus. ? This is especially important for people who are at higher risk of getting very GainPain.com.cy Cover your mouth and nose with a mask when around others  You could spread COVID-19 to others even if you do not feel sick.  Everyone should wear a mask in public settings and when around people not living in their household, especially when social distancing is difficult to maintain. ? Masks should not be placed on young children under age 92, anyone who has trouble breathing, or is unconscious, incapacitated or otherwise unable to remove the mask without assistance.  The mask is meant to protect other people in case you are infected.  Do NOT use a facemask meant for a Dietitian.  Continue to keep about 6 feet between yourself and others. The mask is not a substitute for social distancing. Cover coughs and sneezes  Always cover your mouth and nose with a tissue when you cough or sneeze or use the inside of your elbow.  Throw used tissues in the trash.  Immediately wash your hands with soap and water for at least 20 seconds. If soap and water are not readily available, clean your hands with a hand sanitizer that contains at least 60% alcohol. Clean and disinfect  Clean AND disinfect  frequently touched surfaces daily. This includes tables, doorknobs, light switches, countertops, handles, desks, phones, keyboards, toilets, faucets, and sinks. RackRewards.fr  If surfaces are dirty, clean them: Use detergent or soap and water prior to disinfection.  Then, use a household disinfectant. You can see a list of EPA-registered household disinfectants here. [email or web address].[email or web address] 05/15/2019 This information is not intended to replace advice given to you by  your health care provider. Make sure you discuss any questions you have with your health care provider. Document Revised: 05/23/2019 Document Reviewed: 03/21/2019 Elsevier Patient Education  [place].   COVID-19 Frequently Asked Questions COVID-19 (coronavirus disease) is an infection that is caused by a large family of viruses. Some viruses cause illness in people and others cause illness in animals like camels, cats, and bats. In some cases, the viruses that cause illness in animals can spread to humans. Where did the coronavirus come from? In December 2019, [place] told the [facility]) of several cases of lung disease (human respiratory illness). These cases were linked to an open seafood and livestock market in the city of [place]. The link to the seafood and livestock market suggests that the virus may have spread from animals to humans. However, since that first outbreak in December, the virus has also been shown to spread from person to person. What is the name of the disease and the virus? Disease name Early on, this disease was called novel coronavirus. This is because scientists determined that the disease was caused by a new (novel) respiratory virus. The World Health Organization [facility]) has now named the disease COVID-19, or coronavirus disease. Virus name The virus that causes the disease is called severe acute respiratory syndrome  coronavirus 2 (SARS-CoV-2). More information on disease and virus naming World Health Organization [facility]): www.who.int/emergencies/diseases/novel-coronavirus-2019/technical-guidance/naming-the-coronavirus-disease-(covid-2019)-and-the-virus-that-causes-it Who is at risk for complications from coronavirus disease? Some people may be at higher risk for complications from coronavirus disease. This includes older adults and people who have chronic diseases, such as heart disease, diabetes, and lung disease. If you are at higher risk for complications, take these extra precautions:  Stay home as much as possible.  Avoid social gatherings and travel.  Avoid close contact with others. Stay at least 6 ft (2 m) away from others, if possible.  Wash your hands often with soap and water for at least 20 seconds.  Avoid touching your face, mouth, nose, or eyes.  Keep supplies on hand at home, such as food, medicine, and cleaning supplies.  If you must go out in public, wear a cloth face covering or face mask. Make sure your mask covers your nose and mouth. How does coronavirus disease spread? The virus that causes coronavirus disease spreads easily from person to person (is contagious). You may catch the virus by:  Breathing in droplets from an infected person. Droplets can be spread by a person breathing, speaking, singing, coughing, or sneezing.  Touching something, like a table or a doorknob, that was exposed to the virus (contaminated) and then touching your mouth, nose, or eyes. Can I get the virus from touching surfaces or objects? There is still a lot that we do not know about the virus that causes coronavirus disease. Scientists are basing a lot of information on what they know about similar viruses, such as:  Viruses cannot generally survive on surfaces for long. They need a human body (host) to survive.  It is more likely that the virus is spread by close contact with people who are sick  (direct contact), such as through: ? Shaking hands or hugging. ? Breathing in respiratory droplets that travel through the air. Droplets can be spread by a person breathing, speaking, singing, coughing, or sneezing.  It is less likely that the virus is spread when a person touches a surface or object that has the virus on it (indirect contact). The virus may be able to enter  the body if the person touches a surface or object and then touches his or her face, eyes, nose, or mouth. Can a person spread the virus without having symptoms of the disease? It may be possible for the virus to spread before a person has symptoms of the disease, but this is most likely not the main way the virus is spreading. It is more likely for the virus to spread by being in close contact with people who are sick and breathing in the respiratory droplets spread by a person breathing, speaking, singing, coughing, or sneezing. What are the symptoms of coronavirus disease? Symptoms vary from person to person and can range from mild to severe. Symptoms may include:  Fever or chills.  Cough.  Difficulty breathing or feeling short of breath.  Headaches, body aches, or muscle aches.  Runny or stuffy (congested) nose.  Sore throat.  New loss of taste or smell.  Nausea, vomiting, or diarrhea. These symptoms can appear anywhere from 2 to 14 days after you have been exposed to the virus. Some people may not have any symptoms. If you develop symptoms, call your health care provider. People with severe symptoms may need hospital care. Should I be tested for this virus? Your health care provider will decide whether to test you based on your symptoms, history of exposure, and your risk factors. How does a health care provider test for this virus? Health care providers will collect samples to send for testing. Samples may include:  Taking a swab of fluid from the back of your nose and throat, your nose, or your  throat.  Taking fluid from the lungs by having you cough up mucus (sputum) into a sterile cup.  Taking a blood sample. Is there a treatment or vaccine for this virus? Currently, there is no vaccine to prevent coronavirus disease. Also, there are no medicines like antibiotics or antivirals to treat the virus. A person who becomes sick is given supportive care, which means rest and fluids. A person may also relieve his or her symptoms by using over-the-counter medicines that treat sneezing, coughing, and runny nose. These are the same medicines that a person takes for the common cold. If you develop symptoms, call your health care provider. People with severe symptoms may need hospital care. What can I do to protect myself and my family from this virus?     You can protect yourself and your family by taking the same actions that you would take to prevent the spread of other viruses. Take the following actions:  Wash your hands often with soap and water for at least 20 seconds. If soap and water are not available, use alcohol-based hand sanitizer.  Avoid touching your face, mouth, nose, or eyes.  Cough or sneeze into a tissue, sleeve, or elbow. Do not cough or sneeze into your hand or the air. ? If you cough or sneeze into a tissue, throw it away immediately and wash your hands.  Disinfect objects and surfaces that you frequently touch every day.  Stay away from people who are sick.  Avoid going out in public, follow guidance from your state and local health authorities.  Avoid crowded indoor spaces. Stay at least 6 ft (2 m) away from others.  If you must go out in public, wear a cloth face covering or face mask. Make sure your mask covers your nose and mouth.  Stay home if you are sick, except to get medical care. Call your health care provider  before you get medical care. Your health care provider will tell you how long to stay home.  Make sure your vaccines are up to date. Ask your  health care provider what vaccines you need. What should I do if I need to travel? Follow travel recommendations from your local health authority, the CDC, and WHO. Travel information and advice  Centers for Disease Control and Prevention (CDC): BodyEditor.hu  World Health Organization [facility]): ThirdIncome.ca Know the risks and take action to protect your health  You are at higher risk of getting coronavirus disease if you are traveling to areas with an outbreak or if you are exposed to travelers from areas with an outbreak.  Wash your hands often and practice good hygiene to lower the risk of catching or spreading the virus. What should I do if I am sick? General instructions to stop the spread of infection  Wash your hands often with soap and water for at least 20 seconds. If soap and water are not available, use alcohol-based hand sanitizer.  Cough or sneeze into a tissue, sleeve, or elbow. Do not cough or sneeze into your hand or the air.  If you cough or sneeze into a tissue, throw it away immediately and wash your hands.  Stay home unless you must get medical care. Call your health care provider or local health authority before you get medical care.  Avoid public areas. Do not take public transportation, if possible.  If you can, wear a mask if you must go out of the house or if you are in close contact with someone who is not sick. Make sure your mask covers your nose and mouth. Keep your home clean  Disinfect objects and surfaces that are frequently touched every day. This may include: ? Counters and tables. ? Doorknobs and light switches. ? Sinks and faucets. ? Electronics such as phones, remote controls, keyboards, computers, and tablets.  Wash dishes in hot, soapy water or use a dishwasher. Air-dry your dishes.  Wash laundry in hot water. Prevent infecting other household  members  Let healthy household members care for children and pets, if possible. If you have to care for children or pets, wash your hands often and wear a mask.  Sleep in a different bedroom or bed, if possible.  Do not share personal items, such as razors, toothbrushes, deodorant, combs, brushes, towels, and washcloths. Where to find more information Centers for Disease Control and Prevention (CDC)  Information and news updates: https://www.butler-gonzalez.com/ World Health Organization [facility])  Information and news updates: MissExecutive.com.ee  Coronavirus health topic: https://www.castaneda.info/  Questions and answers on COVID-19: OpportunityDebt.at  Global tracker: who.sprinklr.com American Academy of Pediatrics (AAP)  Information for families: www.healthychildren.org/English/health-issues/conditions/chest-lungs/Pages/2019-Novel-Coronavirus.aspx The coronavirus situation is changing rapidly. Check your local health authority website or the CDC and [facility] websites for updates and news. When should I contact a health care provider?  Contact your health care provider if you have symptoms of an infection, such as fever or cough, and you: ? Have been near anyone who is known to have coronavirus disease. ? Have come into contact with a person who is suspected to have coronavirus disease. ? Have traveled to an area where there is an outbreak of COVID-19. When should I get emergency medical care?  Get help right away by calling your local emergency services (911 in the [place] if you have: ? Trouble breathing. ? Pain or pressure in your chest. ? Confusion. ? Blue-tinged lips and fingernails. ? Difficulty waking from sleep. ?  Symptoms that get worse. Let the emergency medical personnel know if you think you have coronavirus disease. Summary  A new respiratory virus is spreading from person to person and  causing COVID-19 (coronavirus disease).  The virus that causes COVID-19 appears to spread easily. It spreads from one person to another through droplets from breathing, speaking, singing, coughing, or sneezing.  Older adults and those with chronic diseases are at higher risk of disease. If you are at higher risk for complications, take extra precautions.  There is currently no vaccine to prevent coronavirus disease. There are no medicines, such as antibiotics or antivirals, to treat the virus.  You can protect yourself and your family by washing your hands often, avoiding touching your face, and covering your coughs and sneezes. This information is not intended to replace advice given to you by your health care provider. Make sure you discuss any questions you have with your health care provider. Document Revised: 06/28/2019 Document Reviewed: 12/25/2018 Elsevier Patient Education  [place]: Quarantine vs. Isolation QUARANTINE keeps someone who was in close contact with someone who has COVID-19 away from others. If you had close contact with a person who has COVID-19  Stay home until 14 days after your last contact.  Check your temperature twice a day and watch for symptoms of COVID-19.  If possible, stay away from people who are at higher-risk for getting very sick from COVID-19. ISOLATION keeps someone who is sick or tested positive for COVID-19 without symptoms away from others, even in their own home. If you are sick and think or know you have COVID-19  Stay home until after ? At least 10 days since symptoms first appeared and ? At least 24 hours with no fever without fever-reducing medication and ? Symptoms have improved If you tested positive for COVID-19 but do not have symptoms  Stay home until after ? 10 days have passed since your positive test If you live with others, stay in a specific "sick room" or area and away from other people or animals,  including pets. Use a separate bathroom, if available. [email or web address] 04/01/2019 This information is not intended to replace advice given to you by your health care provider. Make sure you discuss any questions you have with your health care provider. Document Revised: 08/15/2019 Document Reviewed: 08/15/2019 Elsevier Patient Education  [place] Can Do to Manage Your COVID-19 Symptoms at Home If you have possible or confirmed COVID-19: 1. Stay home from work and school. And stay away from other public places. If you must go out, avoid using any kind of public transportation, ridesharing, or taxis. 2. Monitor your symptoms carefully. If your symptoms get worse, call your healthcare provider immediately. 3. Get rest and stay hydrated. 4. If you have a medical appointment, call the healthcare provider ahead of time and tell them that you have or may have COVID-19. 5. For medical emergencies, call 911 and notify the dispatch personnel that you have or may have COVID-19. 6. Cover your cough and sneezes with a tissue or use the inside of your elbow. 7. Wash your hands often with soap and water for at least 20 seconds or clean your hands with an alcohol-based hand sanitizer that contains at least 60% alcohol. 8. As much as possible, stay in a specific room and away from other people in your home. Also, you should use a separate bathroom, if available. If you need to be around other people in  or outside of the home, wear a mask. 9. Avoid sharing personal items with other people in your household, like dishes, towels, and bedding. 10. Clean all surfaces that are touched often, like counters, tabletops, and doorknobs. Use household cleaning sprays or wipes according to the label instructions. [email or web address] 03/13/2019 This information is not intended to replace advice given to you by your health care provider. Make sure you discuss any questions you have with your  health care provider. Document Revised: 08/15/2019 Document Reviewed: 08/15/2019 Elsevier Patient Education  2020 Elsevier Inc.    Person Under Monitoring Name: Jonathan Harper  Location: [address] [place] [address]   CORONAVIRUS DISEASE 2019 (COVID-19) Guidance for Persons Under Investigation You are being tested for the virus that causes coronavirus disease 2019 (COVID-19). Public health actions are necessary to ensure protection of your health and the health of others, and to prevent further spread of infection. COVID-19 is caused by a virus that can cause symptoms, such as fever, cough, and shortness of breath. The primary transmission from person to person is by coughing or sneezing. On October 11, 2018, the World Health Organization announced a [employer] Emergency of International Concern and on October 12, 2018 the [place]. Department of Health and Human Services declared a public health emergency. If the virus that causesCOVID-19 spreads in the community, it could have severe public health consequences.  As a person under investigation for COVID-19, the [employer] of Health and [employer] Division of [employer] advises you to adhere to the following guidance until your test results are reported to you. If your test result is positive, you will receive additional information from your provider and your local health department at that time.   Remain at home until you are cleared by your health provider or public health authorities.   Keep a log of visitors to your home using the form provided. Any visitors to your home must be aware of your isolation status.  If you plan to move to a new address or leave the county, notify the local health department in your county.  Call a doctor or seek care if you have an urgent medical need. Before seeking medical care, call ahead and get instructions from the provider before arriving at the medical office, clinic or  hospital. Notify them that you are being tested for the virus that causes COVID-19 so arrangements can be made, as necessary, to prevent transmission to others in the healthcare setting. Next, notify the local health department in your county.  If a medical emergency arises and you need to call 911, inform the first responders that you are being tested for the virus that causes COVID-19. Next, notify the local health department in your county.  Adhere to all guidance set forth by the [facility] of [employer] for [facility] of patients that is based on guidance from the Center for Disease Control and Prevention with suspected or confirmed COVID-19. It is provided with this guidance for Persons Under Investigation.  Your health and the health of our community are our top priorities. Public Health officials remain available to provide assistance and counseling to you about COVID-19 and compliance with this guidance.  Provider: ____________________________________________________________ Date: ______/_____/_________  By signing below, you acknowledge that you have read and agree to comply with this Guidance for Persons Under Investigation. ______________________________________________________________ Date: ______/_____/_________  WHO DO I CALL? You can find a list of local health departments here: [email or web address] Health Department: ____________________________________________________________________  Contact Name: ________________________________________________________________________ Telephone: ___________________________________________________________________________  [facility], [place], Communicable Disease Branch COVID-19 Guidance for Persons Under Investigation November 17, 2018   Person Under Monitoring Name: Jonathan Harper  Location: [place] [address]   Infection Prevention  Recommendations for Individuals Confirmed to have, or Being Evaluated for, 2019 Novel Coronavirus (COVID-19) Infection Who Receive Care at Home  Individuals who are confirmed to have, or are being evaluated for, COVID-19 should follow the prevention steps below until a healthcare provider or local or state health department says they can return to normal activities.  Stay home except to get medical care You should restrict activities outside your home, except for getting medical care. Do not go to work, school, or public areas, and do not use public transportation or taxis.  Call ahead before visiting your doctor Before your medical appointment, call the healthcare provider and tell them that you have, or are being evaluated for, COVID-19 infection. This will help the healthcare provider's office take steps to keep other people from getting infected. Ask your healthcare provider to call the local or state health department.  Monitor your symptoms Seek prompt medical attention if your illness is worsening (e.g., difficulty breathing). Before going to your medical appointment, call the healthcare provider and tell them that you have, or are being evaluated for, COVID-19 infection. Ask your healthcare provider to call the local or state health department.  Wear a facemask You should wear a facemask that covers your nose and mouth when you are in the same room with other people and when you visit a healthcare provider. People who live with or visit you should also wear a facemask while they are in the same room with you.  Separate yourself from other people in your home As much as possible, you should stay in a different room from other people in your home. Also, you should use a separate bathroom, if available.  Avoid sharing household items You should not share dishes, drinking glasses, cups, eating utensils, towels, bedding, or other items with other people in your home. After using  these items, you should wash them thoroughly with soap and water.  Cover your coughs and sneezes Cover your mouth and nose with a tissue when you cough or sneeze, or you can cough or sneeze into your sleeve. Throw used tissues in a lined trash can, and immediately wash your hands with soap and water for at least 20 seconds or use an alcohol-based hand rub.  Wash your [facility] your hands often and thoroughly with soap and water for at least 20 seconds. You can use an alcohol-based hand sanitizer if soap and water are not available and if your hands are not visibly dirty. Avoid touching your eyes, nose, and mouth with unwashed hands.   Prevention Steps for Caregivers and Household Members of Individuals Confirmed to have, or Being Evaluated for, COVID-19 Infection Being Cared for in the Home  If you live with, or provide care at home for, a person confirmed to have, or being evaluated for, COVID-19 infection please follow these guidelines to prevent infection:  Follow healthcare provider's instructions Make sure that you understand and can help the patient follow any healthcare provider instructions for all care.  Provide for the patient's basic needs You should help the patient with basic needs in the home and provide support for getting groceries, prescriptions, and other personal needs.  Monitor the patient's symptoms If they are getting sicker, call  his or her medical provider and tell them that the patient has, or is being evaluated for, COVID-19 infection. This will help the healthcare provider's office take steps to keep other people from getting infected. Ask the healthcare provider to call the local or state health department.  Limit the number of people who have contact with the patient  If possible, have only one caregiver for the patient.  Other household members should stay in another home or place of residence. If this is not possible, they should stay  in another  room, or be separated from the patient as much as possible. Use a separate bathroom, if available.  Restrict visitors who do not have an essential need to be in the home.  Keep older adults, very young children, and other sick people away from the patient Keep older adults, very young children, and those who have compromised immune systems or chronic health conditions away from the patient. This includes people with chronic heart, lung, or kidney conditions, diabetes, and cancer.  Ensure good ventilation Make sure that shared spaces in the home have good air flow, such as from an air conditioner or an opened window, weather permitting.  Wash your hands often  Wash your hands often and thoroughly with soap and water for at least 20 seconds. You can use an alcohol based hand sanitizer if soap and water are not available and if your hands are not visibly dirty.  Avoid touching your eyes, nose, and mouth with unwashed hands.  Use disposable paper towels to dry your hands. If not available, use dedicated cloth towels and replace them when they become wet.  Wear a facemask and gloves  Wear a disposable facemask at all times in the room and gloves when you touch or have contact with the patient's blood, body fluids, and/or secretions or excretions, such as sweat, saliva, sputum, nasal mucus, vomit, urine, or feces.  Ensure the mask fits over your nose and mouth tightly, and do not touch it during use.  Throw out disposable facemasks and gloves after using them. Do not reuse.  Wash your hands immediately after removing your facemask and gloves.  If your personal clothing becomes contaminated, carefully remove clothing and launder. Wash your hands after handling contaminated clothing.  Place all used disposable facemasks, gloves, and other waste in a lined container before disposing them with other household waste.  Remove gloves and wash your hands immediately after handling these items.  Do  not share dishes, glasses, or other household items with the patient  Avoid sharing household items. You should not share dishes, drinking glasses, cups, eating utensils, towels, bedding, or other items with a patient who is confirmed to have, or being evaluated for, COVID-19 infection.  After the person uses these items, you should wash them thoroughly with soap and water.  Wash laundry thoroughly  Immediately remove and wash clothes or bedding that have blood, body fluids, and/or secretions or excretions, such as sweat, saliva, sputum, nasal mucus, vomit, urine, or feces, on them.  Wear gloves when handling laundry from the patient.  Read and follow directions on labels of laundry or clothing items and detergent. In general, wash and dry with the warmest temperatures recommended on the label.  Clean all areas the individual has used often  Clean all touchable surfaces, such as counters, tabletops, doorknobs, bathroom fixtures, toilets, phones, keyboards, tablets, and bedside tables, every day. Also, clean any surfaces that may have blood, body fluids, and/or secretions or excretions on  them.  Wear gloves when cleaning surfaces the patient has come in contact with.  Use a diluted bleach solution (e.g., dilute bleach with 1 part bleach and 10 parts water) or a household disinfectant with a label that says EPA-registered for coronaviruses. To make a bleach solution at home, add 1 tablespoon of bleach to 1 quart (4 cups) of water. For a larger supply, add  cup of bleach to 1 gallon (16 cups) of water.  Read labels of cleaning products and follow recommendations provided on product labels. Labels contain instructions for safe and effective use of the cleaning product including precautions you should take when applying the product, such as wearing gloves or eye protection and making sure you have good ventilation during use of the product.  Remove gloves and wash hands immediately after  cleaning.  Monitor yourself for signs and symptoms of illness Caregivers and household members are considered close contacts, should monitor their health, and will be asked to limit movement outside of the home to the extent possible. Follow the monitoring steps for close contacts listed on the symptom monitoring form.   ? If you have additional questions, contact your local health department or call the epidemiologist on call at [phone] (available 24/7). ? This guidance is subject to change. For the most up-to-date guidance from [facility], please refer to their website: [email or web address]

## 2019-12-26 NOTE — Discharge Summary (Signed)
[place] [facility]   PATIENT NAME: Jonathan Harper    MR#:  [id number]  DATE OF BIRTH:  08-Jan-1981  DATE OF ADMISSION:  12/22/2019   ADMITTING PHYSICIAN: Anselm Jungling, DO  DATE OF DISCHARGE: 12/26/2019 11:53 AM  PRIMARY CARE PHYSICIAN: Hannah Beat, MD   ADMISSION DIAGNOSIS:  Shortness of breath [R06.02] Non-intractable vomiting with nausea, unspecified vomiting type [R11.2] Pneumonia due to COVID-19 virus [U07.1, J12.82] DISCHARGE DIAGNOSIS:  Active Problems:   Pneumonia due to COVID-19 virus   GERD (gastroesophageal reflux disease)   Non-intractable vomiting   Shortness of breath  SECONDARY DIAGNOSIS:   Past Medical History:  Diagnosis Date  . COVID-19   . Depression   . History of nephrolithiasis    HOSPITAL COURSE:  Jonathan Harper a 38 y.o.malewith medical history significant forOSA not on CPAP, pre-diabetes, and GERD admitted for persistent fever and shortness of breath. He was diagnosed with COVID on 4/6 by PCP.He was started on prednisone and Azithromycin. Since then he was having daily fever up to 101.Frequent cough productive of sputum and hashemoptysis.Also having frequent vomiting and some diarrhea. Unable to keep much down. Has shortness of breath worse with exertion. No chest pain. No abdominal pain. No LE edema.  ED Course:He had fever of 101.80F with hypoxia down to 87% requiring up to 4L. No leukocytosis. Glucose of 121 otherwise other electrolytes normal. Lactate acid of 2.8. PCT of 0.11. CTA shows no PE but had multifocal pneumonia with trace bilateral pleural effusions  Acute hypoxic respiratory failure secondary to COVID pneumonia Completed course of remdesevir and steroids, vit c and zinc with good response. On room air  GERD Continue PPI   DISCHARGE CONDITIONS:  stable CONSULTS OBTAINED:   DRUG ALLERGIES:  No Known Allergies DISCHARGE MEDICATIONS:   Allergies as of 12/26/2019   No Known Allergies       Medication List    STOP taking these medications   azithromycin 250 MG tablet Commonly known as: ZITHROMAX   naproxen sodium 220 MG tablet Commonly known as: ALEVE   predniSONE 20 MG tablet Commonly known as: DELTASONE     TAKE these medications   albuterol 108 (90 Base) MCG/ACT inhaler Commonly known as: VENTOLIN HFA   esomeprazole 20 MG capsule Commonly known as: NEXIUM Take 20 mg by mouth daily.   vitamin C 250 MG tablet Commonly known as: ASCORBIC ACID Take 1 tablet (250 mg total) by mouth daily.   zinc sulfate 220 (50 Zn) MG capsule Take 1 capsule (220 mg total) by mouth 2 (two) times daily.      DISCHARGE INSTRUCTIONS:   DIET:  Regular diet DISCHARGE CONDITION:  Stable ACTIVITY:  Activity as tolerated OXYGEN:  Home Oxygen: No.  Oxygen Delivery: room air DISCHARGE LOCATION:  home   If you experience worsening of your admission symptoms, develop shortness of breath, life threatening emergency, suicidal or homicidal thoughts you must seek medical attention immediately by calling 911 or calling your MD immediately  if symptoms less severe.  You Must read complete instructions/literature along with all the possible adverse reactions/side effects for all the Medicines you take and that have been prescribed to you. Take any new Medicines after you have completely understood and accpet all the possible adverse reactions/side effects.   Please note  You were cared for by a hospitalist during your hospital stay. If you have any questions about your discharge medications or the care you received while you were in the hospital after you  are discharged, you can call the unit and asked to speak with the hospitalist on call if the hospitalist that took care of you is not available. Once you are discharged, your primary care physician will handle any further medical issues. Please note that NO REFILLS for any discharge medications will be authorized once you are discharged,  as it is imperative that you return to your primary care physician (or establish a relationship with a primary care physician if you do not have one) for your aftercare needs so that they can reassess your need for medications and monitor your lab values.    On the day of Discharge:  VITAL SIGNS:  Blood pressure (!) 149/104, pulse 74, temperature 97.9 F (36.6 C), temperature source Oral, resp. rate 17, height 6' 1.5" (1.867 m), weight 127.5 kg, SpO2 91 %. PHYSICAL EXAMINATION:  GENERAL:  39 y.o.-year-old patient lying in the bed with no acute distress.  EYES: Pupils equal, round, reactive to light and accommodation. No scleral icterus. Extraocular muscles intact.  HEENT: Head atraumatic, normocephalic. Oropharynx and nasopharynx clear.  NECK:  Supple, no jugular venous distention. No thyroid enlargement, no tenderness.  LUNGS: Normal breath sounds bilaterally, no wheezing, rales,rhonchi or crepitation. No use of accessory muscles of respiration.  CARDIOVASCULAR: S1, S2 normal. No murmurs, rubs, or gallops.  ABDOMEN: Soft, non-tender, non-distended. Bowel sounds present. No organomegaly or mass.  EXTREMITIES: No pedal edema, cyanosis, or clubbing.  NEUROLOGIC: Cranial nerves II through XII are intact. Muscle strength 5/5 in all extremities. Sensation intact. Gait not checked.  PSYCHIATRIC: The patient is alert and oriented x 3.  SKIN: No obvious rash, lesion, or ulcer.  DATA REVIEW:   CBC Recent Labs  Lab 12/22/19 1456  WBC 8.1  HGB 13.7  HCT 41.7  PLT 200    Chemistries  Recent Labs  Lab 12/22/19 1456  NA 135  K 3.6  CL 101  CO2 24  GLUCOSE 121*  BUN 14  CREATININE 0.75  CALCIUM 8.5*  AST 13*  ALT 22  ALKPHOS 78  BILITOT 0.5     Outpatient follow-up Follow-up Information    Owens Loffler, MD. Go on 01/06/2020.   Specialties: Family Medicine, Sports Medicine Why: at 11:40 a.m. Contact information: [place]  [id number] [phone]            Management plans discussed with the patient, family and they are in agreement.  CODE STATUS: Prior   TOTAL TIME TAKING CARE OF THIS PATIENT: 45 minutes.    Max Sane M.D on 12/26/2019 at 9:07 PM  [facility]   CC: Primary care physician; Owens Loffler, MD   Note: This dictation was prepared with Dragon dictation along with smaller phrase technology. Any transcriptional errors that result from this process are unintentional.

## 2019-12-26 NOTE — Plan of Care (Signed)
Discharged to home via private vehicle with significant other providing transportation. Verbalized understanding of all discharge education provided to include importance of keeping all scheduled appointments and Covid precautions

## 2019-12-27 ENCOUNTER — Encounter (INDEPENDENT_AMBULATORY_CARE_PROVIDER_SITE_OTHER): Payer: Self-pay

## 2019-12-27 LAB — CULTURE, BLOOD (ROUTINE X 2)
Culture: NO GROWTH
Culture: NO GROWTH
Special Requests: ADEQUATE

## 2019-12-28 ENCOUNTER — Encounter (INDEPENDENT_AMBULATORY_CARE_PROVIDER_SITE_OTHER): Payer: Self-pay

## 2019-12-29 ENCOUNTER — Encounter (INDEPENDENT_AMBULATORY_CARE_PROVIDER_SITE_OTHER): Payer: Self-pay

## 2020-01-06 ENCOUNTER — Ambulatory Visit: Payer: BC Managed Care – PPO | Admitting: Family Medicine

## 2020-01-06 ENCOUNTER — Encounter: Payer: Self-pay | Admitting: Family Medicine

## 2020-01-06 ENCOUNTER — Other Ambulatory Visit: Payer: Self-pay

## 2020-01-06 VITALS — BP 120/94 | HR 76 | Temp 98.3°F | Ht 73.0 in | Wt 300.2 lb

## 2020-01-06 DIAGNOSIS — J9601 Acute respiratory failure with hypoxia: Secondary | ICD-10-CM

## 2020-01-06 DIAGNOSIS — U071 COVID-19: Secondary | ICD-10-CM

## 2020-01-06 DIAGNOSIS — J1282 Pneumonia due to coronavirus disease 2019: Secondary | ICD-10-CM | POA: Diagnosis not present

## 2020-01-06 NOTE — Progress Notes (Signed)
Jonathan Figgs T. Tevion Laforge, MD, CAQ Sports Medicine  Primary Care and Sports Medicine [facility] at [facility] [address] [place], [address]  Phone: [phone]  FAX: [phone]  Jonathan Harper - 39 y.o. male  MRN [id number]  Date of Birth: 30-Mar-1981  Date: 01/06/2020  PCP: Hannah Beat, MD  Referral: Hannah Beat, MD  Chief Complaint  Patient presents with  . Hospitalization Follow-up    PNA due to Covid 19/N&V    This visit occurred during the SARS-CoV-2 public health emergency.  Safety protocols were in place, including screening questions prior to the visit, additional usage of staff PPE, and extensive cleaning of exam room while observing appropriate contact time as indicated for disinfecting solutions.   Subjective:   Jonathan Harper is a 39 y.o. very pleasant male patient with Body mass index is 39.61 kg/m. who presents with the following:  TCM 14  I have not seen this patient in 5 years.  Here for hospital follow-up:  DATE OF BIRTH:  1981-02-26  DATE OF ADMISSION:  12/22/2019     ADMITTING PHYSICIAN: Anselm Jungling, DO  DATE OF DISCHARGE: 12/26/2019 11:53 AM  Covid-19 PNA  Outpatient: 4/6 dx, zpak and steroids  He continued to worsen, and on the day of admission he was hypoxemic to 87% and he was coughing up blood, and he also had some intractable nausea and vomiting. Daily temp t o101 Hemoptysis Vomitting Ox to 87%  While in hospital: He was found to had COVID-19 pneumonia. Hypoxic resp failure Remdesevir Steroids CT angiogram of the chest as below.  Multi lobar pneumonia.  Could not get out of bed.  Not eating or putting fluid back into it.  When he was discharged from the hospital he felt quite weak, and this is slowly improved.  At this point he is still having some weakness and some shortness of breath with walking up and down a flight of stairs.  SOB is all gone.  Started at work this morning. He feels okay  if weak.  Review of Systems is noted in the HPI, as appropriate  Objective:   BP (!) 120/94   Pulse 76   Temp 98.3 F (36.8 C) (Temporal)   Ht 6\' 1"  (1.854 m)   Wt (!) 300 lb 4 oz (136.2 kg)   SpO2 95%   BMI 39.61 kg/m   GEN: No acute distress; alert,appropriate. PULM: Breathing comfortably in no respiratory distress PSYCH: Normally interactive.  CV: RRR, no m/g/r  PULM: Normal respiratory rate, no accessory muscle use. No wheezes, crackles or rhonchi   Laboratory and Imaging Data: CT Angio Chest PE W/Cm &/Or Wo Cm  Result Date: 12/22/2019 CLINICAL DATA:  Shortness of breath, cough, COVID positive. Evaluate for PE. EXAM: CT ANGIOGRAPHY CHEST WITH CONTRAST TECHNIQUE: Multidetector CT imaging of the chest was performed using the standard protocol during bolus administration of intravenous contrast. Multiplanar CT image reconstructions and MIPs were obtained to evaluate the vascular anatomy. CONTRAST:  02/21/2020 OMNIPAQUE IOHEXOL 350 MG/ML SOLN COMPARISON:  Chest radiograph dated 12/22/2019 FINDINGS: Cardiovascular: Suboptimal contrast opacification of the bilateral pulmonary arteries due to bolus timing and respiratory motion. No evidence of central pulmonary embolism. No evidence of thoracic aortic aneurysm or dissection. The heart is normal in size.  No pericardial effusion. Mediastinum/Nodes: No suspicious mediastinal lymphadenopathy. Visualized thyroid is unremarkable. Lungs/Pleura: Multifocal subpleural patchy opacities in the lungs bilaterally, lower lobe predominant, likely reflecting multifocal pneumonia in this patient with known COVID.  Trace bilateral pleural effusions. No suspicious pulmonary nodules, although evaluation is limited. No pneumothorax. Upper Abdomen: Visualized upper abdomen is grossly unremarkable. Musculoskeletal: Visualized osseous structures are within normal limits. Review of the MIP images confirms the above findings. IMPRESSION: No evidence of central pulmonary  embolism. Multifocal pneumonia in this patient with known COVID. Trace bilateral pleural effusions. Electronically Signed   By: Julian Hy M.D.   On: 12/22/2019 18:08   DG Chest Portable 1 View  Result Date: 12/22/2019 CLINICAL DATA:  Shortness of breath and fever.  COVID-19 positive. EXAM: PORTABLE CHEST 1 VIEW COMPARISON:  01/26/2016 FINDINGS: Lungs are hypoinflated with mild patchy bilateral opacification left worse than right suggesting multifocal pneumonia. No effusion. Cardiomediastinal silhouette and remainder of the exam is unchanged. IMPRESSION: Patchy opacification over the mid to lower lungs left worse than right suggesting multifocal pneumonia. Electronically Signed   By: Marin Olp M.D.   On: 12/22/2019 16:02     Assessment and Plan:     ICD-10-CM   1. Acute hypoxemic respiratory failure due to COVID-19 (HCC)  U07.1    J96.01   2. Pneumonia due to COVID-19 virus  U07.1    J12.82    Globally healthy 39 year old male who presented in hypoxemic respiratory failure to the ER and was admitted with COVID-19 pneumonia.  He was treated supportively, given steroids and Redemsevir.  At this point he is doing well and side for some weakness appears globally improved.  Follow-up: As needed and for general health care.  No orders of the defined types were placed in this encounter.  There are no discontinued medications. No orders of the defined types were placed in this encounter.   Signed,  Maud Deed. Arinze Rivadeneira, MD   Outpatient Encounter Medications as of 01/06/2020  Medication Sig  . albuterol (VENTOLIN HFA) 108 (90 Base) MCG/ACT inhaler   . esomeprazole (NEXIUM) 20 MG capsule Take 20 mg by mouth daily.  . vitamin C (ASCORBIC ACID) 250 MG tablet Take 1 tablet (250 mg total) by mouth daily.  Marland Kitchen zinc sulfate 220 (50 Zn) MG capsule Take 1 capsule (220 mg total) by mouth 2 (two) times daily.  . [DISCONTINUED] benzonatate (TESSALON PERLES) 100 MG capsule Take 1 capsule (100  mg total) by mouth 3 (three) times daily as needed for cough.   No facility-administered encounter medications on file as of 01/06/2020.

## 2020-01-07 ENCOUNTER — Other Ambulatory Visit: Payer: Self-pay | Admitting: Internal Medicine

## 2020-01-07 ENCOUNTER — Encounter (INDEPENDENT_AMBULATORY_CARE_PROVIDER_SITE_OTHER): Payer: Self-pay

## 2020-01-07 DIAGNOSIS — U071 COVID-19: Secondary | ICD-10-CM

## 2020-01-21 ENCOUNTER — Other Ambulatory Visit: Payer: Self-pay | Admitting: Internal Medicine

## 2020-01-27 DIAGNOSIS — M25561 Pain in right knee: Secondary | ICD-10-CM | POA: Diagnosis not present

## 2020-02-18 DIAGNOSIS — M79671 Pain in right foot: Secondary | ICD-10-CM | POA: Diagnosis not present

## 2020-02-18 DIAGNOSIS — T63511A Toxic effect of contact with stingray, accidental (unintentional), initial encounter: Secondary | ICD-10-CM | POA: Diagnosis not present

## 2020-05-20 DIAGNOSIS — N071 Hereditary nephropathy, not elsewhere classified with focal and segmental glomerular lesions: Secondary | ICD-10-CM | POA: Diagnosis not present

## 2020-05-20 DIAGNOSIS — Z125 Encounter for screening for malignant neoplasm of prostate: Secondary | ICD-10-CM | POA: Diagnosis not present

## 2020-06-18 DIAGNOSIS — R748 Abnormal levels of other serum enzymes: Secondary | ICD-10-CM | POA: Diagnosis not present

## 2020-09-02 DIAGNOSIS — G4733 Obstructive sleep apnea (adult) (pediatric): Secondary | ICD-10-CM | POA: Diagnosis not present

## 2020-09-09 DIAGNOSIS — R200 Anesthesia of skin: Secondary | ICD-10-CM | POA: Diagnosis not present

## 2020-09-10 ENCOUNTER — Encounter: Payer: Self-pay | Admitting: Neurology

## 2020-09-16 ENCOUNTER — Other Ambulatory Visit: Payer: Self-pay

## 2020-09-16 ENCOUNTER — Encounter: Payer: BC Managed Care – PPO | Admitting: Neurology

## 2020-09-16 DIAGNOSIS — R202 Paresthesia of skin: Secondary | ICD-10-CM

## 2020-09-18 ENCOUNTER — Ambulatory Visit: Payer: BC Managed Care – PPO | Admitting: Neurology

## 2020-09-18 ENCOUNTER — Other Ambulatory Visit: Payer: Self-pay

## 2020-09-18 DIAGNOSIS — R202 Paresthesia of skin: Secondary | ICD-10-CM

## 2020-09-18 DIAGNOSIS — G5603 Carpal tunnel syndrome, bilateral upper limbs: Secondary | ICD-10-CM

## 2020-09-18 NOTE — Procedures (Signed)
[facility]  [address] [place], [address]  [place], [place] [address] Tel: [phone] Fax:  [phone] Test Date:  09/18/2020  Patient: Jonathan Harper DOB: 12-15-1980 Physician: Nita Sickle, DO  Sex: Male Height: 6\' 1"  Ref Phys: , MD  ID#: Cindee Salt   Technician:    Patient Complaints: This is a 40 year old man referred for evaluation of bilateral hand numbness.  NCV & EMG Findings: Extensive electrodiagnostic testing of the right upper extremity and additional studies of the left shows:  1. Right median sensory response shows prolonged latency (5.3 ms) and reduced amplitude (12.9 V).  Left median sensory response shows prolonged latency (3.9 ms) and normal amplitude.  Bilateral ulnar sensory responses are within normal limits. 2. Bilateral median motor responses show prolonged latency (R5.9, L4.2 ms) with amplitude asymmetrically reduced on the right (6.5 mV).  Bilateral ulnar sensory responses are within normal limits. 3. Chronic motor axonal changes are seen affecting the right abductor pollicis brevis muscle, without accompanying active denervation.  Impression: 1. Right median neuropathy at or distal to the wrist (severe), consistent with a clinical diagnosis of carpal tunnel syndrome.   2. Left median neuropathy at or distal to the wrist (moderate), consistent with a clinical diagnosis of carpal tunnel syndrome.     ___________________________ 40, DO    Nerve Conduction Studies Anti Sensory Summary Table   Stim Site NR Peak (ms) Norm Peak (ms) P-T Amp (V) Norm P-T Amp  Left Median Anti Sensory (2nd Digit)  33C  Wrist    3.9 <3.4 32.0 >20  Right Median Anti Sensory (2nd Digit)  33C  Wrist    5.3 <3.4 12.9 >20  Left Ulnar Anti Sensory (5th Digit)  33C  Wrist    2.8 <3.1 29.5 >12  Right Ulnar Anti Sensory (5th Digit)  33C  Wrist    2.6 <3.1 35.5 >12   Motor Summary Table   Stim Site NR Onset (ms) Norm Onset (ms) O-P Amp (mV) Norm  O-P Amp Site1 Site2 Delta-0 (ms) Dist (cm) Vel (m/s) Norm Vel (m/s)  Left Median Motor (Abd Poll Brev)  33C  Wrist    4.2 <3.9 10.5 >6 Elbow Wrist 5.2 29.0 56 >50  Elbow    9.4  9.8         Right Median Motor (Abd Poll Brev)  33C  Wrist    5.9 <3.9 6.5 >6 Elbow Wrist 5.1 28.0 55 >50  Elbow    11.0  6.0         Left Ulnar Motor (Abd Dig Minimi)  33C  Wrist    2.2 <3.1 9.3 >7 B Elbow Wrist 4.2 26.0 62 >50  B Elbow    6.4  8.7  A Elbow B Elbow 1.9 10.0 53 >50  A Elbow    8.3  8.6         Right Ulnar Motor (Abd Dig Minimi)  33C  Wrist    2.3 <3.1 9.6 >7 B Elbow Wrist 4.0 26.0 65 >50  B Elbow    6.3  9.2  A Elbow B Elbow 1.7 10.0 59 >50  A Elbow    8.0  9.2          EMG   Side Muscle Ins Act Fibs Psw Fasc Number Recrt Dur Dur. Amp Amp. Poly Poly. Comment  Right 1stDorInt Nml Nml Nml Nml Nml Nml Nml Nml Nml Nml Nml Nml N/A  Right Abd Poll Brev Nml Nml Nml Nml 1- Rapid  Few 1+ Few 1+ Nml Nml N/A  Right PronatorTeres Nml Nml Nml Nml Nml Nml Nml Nml Nml Nml Nml Nml N/A  Right Biceps Nml Nml Nml Nml Nml Nml Nml Nml Nml Nml Nml Nml N/A  Right Triceps Nml Nml Nml Nml Nml Nml Nml Nml Nml Nml Nml Nml N/A  Right Deltoid Nml Nml Nml Nml Nml Nml Nml Nml Nml Nml Nml Nml N/A  Left 1stDorInt Nml Nml Nml Nml Nml Nml Nml Nml Nml Nml Nml Nml N/A  Left Abd Poll Brev Nml Nml Nml Nml Nml Nml Nml Nml Nml Nml Nml Nml N/A  Left PronatorTeres Nml Nml Nml Nml Nml Nml Nml Nml Nml Nml Nml Nml N/A  Left Biceps Nml Nml Nml Nml Nml Nml Nml Nml Nml Nml Nml Nml N/A  Left Triceps Nml Nml Nml Nml Nml Nml Nml Nml Nml Nml Nml Nml N/A  Left Deltoid Nml Nml Nml Nml Nml Nml Nml Nml Nml Nml Nml Nml N/A      Waveforms:

## 2020-09-29 ENCOUNTER — Encounter: Payer: BC Managed Care – PPO | Admitting: Neurology

## 2020-10-02 ENCOUNTER — Other Ambulatory Visit: Payer: Self-pay | Admitting: Orthopedic Surgery

## 2020-10-20 ENCOUNTER — Encounter (HOSPITAL_BASED_OUTPATIENT_CLINIC_OR_DEPARTMENT_OTHER): Payer: Self-pay | Admitting: Orthopedic Surgery

## 2020-10-20 ENCOUNTER — Other Ambulatory Visit: Payer: Self-pay

## 2020-10-23 ENCOUNTER — Inpatient Hospital Stay (HOSPITAL_COMMUNITY): Admission: RE | Admit: 2020-10-23 | Payer: BC Managed Care – PPO | Source: Ambulatory Visit

## 2020-10-23 ENCOUNTER — Other Ambulatory Visit: Payer: Self-pay

## 2020-10-23 ENCOUNTER — Other Ambulatory Visit
Admission: RE | Admit: 2020-10-23 | Discharge: 2020-10-23 | Disposition: A | Discharge status: Home / Self Care | Payer: BC Managed Care – PPO | Source: Ambulatory Visit | Attending: Orthopedic Surgery | Admitting: Orthopedic Surgery

## 2020-10-23 DIAGNOSIS — Z01812 Encounter for preprocedural laboratory examination: Secondary | ICD-10-CM | POA: Diagnosis present

## 2020-10-23 DIAGNOSIS — Z20822 Contact with and (suspected) exposure to covid-19: Secondary | ICD-10-CM | POA: Insufficient documentation

## 2020-10-24 LAB — SARS CORONAVIRUS 2 (TAT 6-24 HRS): SARS Coronavirus 2: NEGATIVE

## 2020-10-27 ENCOUNTER — Ambulatory Visit (HOSPITAL_BASED_OUTPATIENT_CLINIC_OR_DEPARTMENT_OTHER): Payer: BC Managed Care – PPO | Admitting: Certified Registered"

## 2020-10-27 ENCOUNTER — Encounter (HOSPITAL_BASED_OUTPATIENT_CLINIC_OR_DEPARTMENT_OTHER)
Admission: RE | Disposition: A | Discharge status: Home / Self Care | Payer: Self-pay | Source: Home / Self Care | Attending: Orthopedic Surgery

## 2020-10-27 ENCOUNTER — Other Ambulatory Visit: Payer: Self-pay

## 2020-10-27 ENCOUNTER — Encounter (HOSPITAL_BASED_OUTPATIENT_CLINIC_OR_DEPARTMENT_OTHER): Payer: Self-pay | Admitting: Orthopedic Surgery

## 2020-10-27 ENCOUNTER — Ambulatory Visit (HOSPITAL_BASED_OUTPATIENT_CLINIC_OR_DEPARTMENT_OTHER)
Admission: RE | Admit: 2020-10-27 | Discharge: 2020-10-27 | Disposition: A | Discharge status: Home / Self Care | Payer: BC Managed Care – PPO | Attending: Orthopedic Surgery | Admitting: Orthopedic Surgery

## 2020-10-27 DIAGNOSIS — Z87442 Personal history of urinary calculi: Secondary | ICD-10-CM | POA: Insufficient documentation

## 2020-10-27 DIAGNOSIS — Z8616 Personal history of COVID-19: Secondary | ICD-10-CM | POA: Insufficient documentation

## 2020-10-27 DIAGNOSIS — G5601 Carpal tunnel syndrome, right upper limb: Secondary | ICD-10-CM | POA: Insufficient documentation

## 2020-10-27 DIAGNOSIS — G4730 Sleep apnea, unspecified: Secondary | ICD-10-CM | POA: Diagnosis not present

## 2020-10-27 DIAGNOSIS — Z87891 Personal history of nicotine dependence: Secondary | ICD-10-CM | POA: Diagnosis not present

## 2020-10-27 DIAGNOSIS — Z9049 Acquired absence of other specified parts of digestive tract: Secondary | ICD-10-CM | POA: Insufficient documentation

## 2020-10-27 DIAGNOSIS — R7303 Prediabetes: Secondary | ICD-10-CM | POA: Diagnosis not present

## 2020-10-27 HISTORY — DX: Sleep apnea, unspecified: G47.30

## 2020-10-27 HISTORY — DX: Prediabetes: R73.03

## 2020-10-27 HISTORY — PX: CARPAL TUNNEL RELEASE: SHX101

## 2020-10-27 SURGERY — CARPAL TUNNEL RELEASE
Anesthesia: Regional | Site: Wrist | Laterality: Right

## 2020-10-27 MED ORDER — PROMETHAZINE HCL 25 MG/ML IJ SOLN: 6.2500 mg | INTRAMUSCULAR | Status: DC | PRN

## 2020-10-27 MED ORDER — MIDAZOLAM HCL 5 MG/5ML IJ SOLN
INTRAMUSCULAR | Status: DC | PRN
  Administered 2020-10-27: 2 mg via INTRAVENOUS

## 2020-10-27 MED ORDER — PROPOFOL 500 MG/50ML IV EMUL
INTRAVENOUS | Status: DC | PRN
  Administered 2020-10-27: 100 ug/kg/min via INTRAVENOUS

## 2020-10-27 MED ORDER — BUPIVACAINE HCL (PF) 0.25 % IJ SOLN
INTRAMUSCULAR | Status: AC
  Filled 2020-10-27: qty 30

## 2020-10-27 MED ORDER — HYDROMORPHONE HCL 1 MG/ML IJ SOLN: 0.2500 mg | INTRAMUSCULAR | Status: DC | PRN

## 2020-10-27 MED ORDER — TRAMADOL HCL 50 MG PO TABS: 50.0000 mg | ORAL_TABLET | Freq: Four times a day (QID) | ORAL | 0 refills | Status: AC | PRN

## 2020-10-27 MED ORDER — CEFAZOLIN SODIUM-DEXTROSE 2-4 GM/100ML-% IV SOLN
2.0000 g | INTRAVENOUS | Status: AC
  Administered 2020-10-27: 3 g via INTRAVENOUS

## 2020-10-27 MED ORDER — OXYCODONE HCL 5 MG PO TABS: 5.0000 mg | ORAL_TABLET | Freq: Once | ORAL | Status: DC | PRN

## 2020-10-27 MED ORDER — OXYCODONE HCL 5 MG/5ML PO SOLN: 5.0000 mg | Freq: Once | ORAL | Status: DC | PRN

## 2020-10-27 MED ORDER — 0.9 % SODIUM CHLORIDE (POUR BTL) OPTIME
TOPICAL | Status: DC | PRN
  Administered 2020-10-27: 1000 mL

## 2020-10-27 MED ORDER — LACTATED RINGERS IV SOLN: INTRAVENOUS | Status: DC

## 2020-10-27 MED ORDER — MIDAZOLAM HCL 2 MG/2ML IJ SOLN
INTRAMUSCULAR | Status: AC
  Filled 2020-10-27: qty 2

## 2020-10-27 MED ORDER — LIDOCAINE HCL (PF) 1 % IJ SOLN
INTRAMUSCULAR | Status: AC
  Filled 2020-10-27: qty 30

## 2020-10-27 MED ORDER — ONDANSETRON HCL 4 MG/2ML IJ SOLN
INTRAMUSCULAR | Status: DC | PRN
  Administered 2020-10-27: 4 mg via INTRAVENOUS

## 2020-10-27 MED ORDER — FENTANYL CITRATE (PF) 100 MCG/2ML IJ SOLN
INTRAMUSCULAR | Status: AC
  Filled 2020-10-27: qty 2

## 2020-10-27 MED ORDER — CEFAZOLIN SODIUM-DEXTROSE 2-4 GM/100ML-% IV SOLN
INTRAVENOUS | Status: AC
  Filled 2020-10-27: qty 100

## 2020-10-27 MED ORDER — CEFAZOLIN SODIUM-DEXTROSE 2-3 GM-%(50ML) IV SOLR
INTRAVENOUS | Status: DC | PRN
  Administered 2020-10-27: 3 g via INTRAVENOUS

## 2020-10-27 MED ORDER — FENTANYL CITRATE (PF) 100 MCG/2ML IJ SOLN
INTRAMUSCULAR | Status: DC | PRN
  Administered 2020-10-27: 25 ug via INTRAVENOUS
  Administered 2020-10-27: 50 ug via INTRAVENOUS
  Administered 2020-10-27: 25 ug via INTRAVENOUS

## 2020-10-27 MED ORDER — BUPIVACAINE HCL (PF) 0.25 % IJ SOLN
INTRAMUSCULAR | Status: DC | PRN
  Administered 2020-10-27: 10 mL

## 2020-10-27 SURGICAL SUPPLY — 37 items
APL PRP STRL LF DISP 70% ISPRP (MISCELLANEOUS) ×1
BLADE SURG 15 STRL LF DISP TIS (BLADE) ×1 IMPLANT
BLADE SURG 15 STRL SS (BLADE) ×2
BNDG CMPR 9X4 STRL LF SNTH (GAUZE/BANDAGES/DRESSINGS)
BNDG COHESIVE 3X5 TAN STRL LF (GAUZE/BANDAGES/DRESSINGS) ×2 IMPLANT
BNDG COHESIVE 4X5 TAN STRL (GAUZE/BANDAGES/DRESSINGS) ×2 IMPLANT
BNDG CONFORM 4 STRL LF (GAUZE/BANDAGES/DRESSINGS) ×2 IMPLANT
BNDG ESMARK 4X9 LF (GAUZE/BANDAGES/DRESSINGS) IMPLANT
BNDG GAUZE ELAST 4 BULKY (GAUZE/BANDAGES/DRESSINGS) ×2 IMPLANT
CHLORAPREP W/TINT 26 (MISCELLANEOUS) ×2 IMPLANT
CORD BIPOLAR FORCEPS 12FT (ELECTRODE) ×2 IMPLANT
COVER BACK TABLE 60X90IN (DRAPES) ×2 IMPLANT
COVER MAYO STAND STRL (DRAPES) ×2 IMPLANT
COVER WAND RF STERILE (DRAPES) IMPLANT
CUFF TOURN SGL QUICK 18X4 (TOURNIQUET CUFF) ×2 IMPLANT
DRAPE EXTREMITY T 121X128X90 (DISPOSABLE) ×2 IMPLANT
DRAPE SURG 17X23 STRL (DRAPES) ×2 IMPLANT
DRSG PAD ABDOMINAL 8X10 ST (GAUZE/BANDAGES/DRESSINGS) ×2 IMPLANT
GAUZE SPONGE 4X4 12PLY STRL (GAUZE/BANDAGES/DRESSINGS) ×2 IMPLANT
GAUZE SPONGE 4X4 12PLY STRL LF (GAUZE/BANDAGES/DRESSINGS) ×2 IMPLANT
GAUZE XEROFORM 1X8 LF (GAUZE/BANDAGES/DRESSINGS) ×2 IMPLANT
GLOVE SURG ORTHO LTX SZ8 (GLOVE) ×2 IMPLANT
GLOVE SURG UNDER POLY LF SZ8.5 (GLOVE) ×2 IMPLANT
GOWN STRL REUS W/ TWL LRG LVL3 (GOWN DISPOSABLE) IMPLANT
GOWN STRL REUS W/TWL 2XL LVL3 (GOWN DISPOSABLE) ×2 IMPLANT
GOWN STRL REUS W/TWL LRG LVL3 (GOWN DISPOSABLE)
GOWN STRL REUS W/TWL XL LVL3 (GOWN DISPOSABLE) ×2 IMPLANT
NEEDLE PRECISIONGLIDE 27X1.5 (NEEDLE) ×2 IMPLANT
NS IRRIG 1000ML POUR BTL (IV SOLUTION) ×2 IMPLANT
PACK BASIN DAY SURGERY FS (CUSTOM PROCEDURE TRAY) ×2 IMPLANT
STOCKINETTE 4X48 STRL (DRAPES) ×2 IMPLANT
SUT ETHILON 4 0 PS 2 18 (SUTURE) ×2 IMPLANT
SUT VICRYL 4-0 PS2 18IN ABS (SUTURE) IMPLANT
SYR BULB EAR ULCER 3OZ GRN STR (SYRINGE) ×2 IMPLANT
SYR CONTROL 10ML LL (SYRINGE) ×2 IMPLANT
TOWEL GREEN STERILE FF (TOWEL DISPOSABLE) ×2 IMPLANT
UNDERPAD 30X36 HEAVY ABSORB (UNDERPADS AND DIAPERS) ×2 IMPLANT

## 2020-10-27 NOTE — Transfer of Care (Signed)
Immediate Anesthesia Transfer of Care Note  Patient: Jonathan Harper  Procedure(s) Performed: CARPAL TUNNEL RELEASE (Right Wrist)  Patient Location: PACU  Anesthesia Type:MAC and Bier block  Level of Consciousness: awake, alert  and oriented  Airway & Oxygen Therapy: Patient Spontanous Breathing and Patient connected to face mask oxygen  Post-op Assessment: Report given to RN and Post -op Vital signs reviewed and stable  Post vital signs: Reviewed and stable  Last Vitals:  Vitals Value Taken Time  BP    Temp    Pulse    Resp    SpO2      Last Pain:  Vitals:   10/27/20 0820  TempSrc: Oral  PainSc: 2          Complications: No complications documented.

## 2020-10-27 NOTE — Anesthesia Procedure Notes (Signed)
Anesthesia Regional Block: Bier block (IV Regional)   Pre-Anesthetic Checklist: ,, timeout performed, Correct Patient, Correct Site, Correct Laterality, Correct Procedure, Correct Position, site marked, Risks and benefits discussed,  Surgical consent,  Pre-op evaluation,  At surgeon's request and post-op pain management  Laterality: Right  Prep: alcohol swabs        Procedures:,,,,,,,, #20gu IV placed  Narrative:   Performed by: Personally  CRNA: Karen Kitchens, CRNA

## 2020-10-27 NOTE — Op Note (Signed)
NAME: Jonathan Harper MEDICAL RECORD NO: [id number] DATE OF BIRTH: 14-Jan-1981 FACILITY: [facility] LOCATION: [facility] PHYSICIAN: Nicki Reaper, MD   OPERATIVE REPORT   DATE OF PROCEDURE: 10/27/20    PREOPERATIVE DIAGNOSIS:   Carpal tunnel syndrome right   POSTOPERATIVE DIAGNOSIS:   Same   PROCEDURE:   Decompression median nerve right hand   SURGEON: Cindee Salt, M.D.   ASSISTANT: none   ANESTHESIA:  Bier block with sedation and Local   INTRAVENOUS FLUIDS:  Per anesthesia flow sheet.   ESTIMATED BLOOD LOSS:  Minimal.   COMPLICATIONS:  None.   SPECIMENS:  none   TOURNIQUET TIME:    Total Tourniquet Time Documented: Forearm (Right) - 14 minutes Total: Forearm (Right) - 14 minutes    DISPOSITION:  Stable to PACU.   INDICATIONS: Patient is a 40 year old male with a history of numbness and tingling bilateral hands nerve conductions are positive for carpal tunnel syndrome.  This not responded to conservative treatment.  He is elected undergo surgical decompression of the median nerve right hand.  Pre-.  Postoperative course been discussed along with risks and complications.  He is aware that there is no guarantee to the surgery the possibility of infection recurrence injury to arteries nerves tendons incomplete relief symptoms dystrophy of hopes of halting the process of the reasons for procedure with the hope that improvement will occur but is not guaranteed.  Preoperative area the patient is seen the extremity marked by both patient and surgeon antibiotic given  OPERATIVE COURSE: Patient is brought to the operating room placed in supine position with the right arm free.  A forearm IV regional was carried out without difficulty under the direction of the anesthesia department.  He was prepped with ChloraPrep.  3-minute dry time was allowed and timeout taken to confirm patient procedure.  A longitudinal incision was made in the right palm carried down through  subcutaneous tissue.  Bleeders were electrocauterized with bipolar.  The palmar fascia was split.  The superficial palmar arch was identified along with flexor tendon to the ring little finger.  Retractors were placed retracting median nerve radially along with the flexor tendons and the ulnar artery and nerve ulnarly.  Flexor retinaculum was then released on its ulnar border.  A right angle and sectsee retractor were then placed between skin and forearm fascia.  Deep structures were dissected free with blunt dissection.  The proximal aspect of flexor retinaculum distal forearm fascia was then released for approximately 3 cm proximal to the wrist crease under direct vision.  Canal was explored.  Area compression of the nerve was immediately apparent with significantly hyperemic nerve.  The wound was copious irrigated with saline after identification of the motor branch heading entering into muscle distally.  The wound was then closed with interrupted 4-0 nylon sutures.  Local infiltration quarter percent bupivacaine without epinephrine was given approximately 10 cc was used.  Sterile compressive dressing with fingers 3 was applied.  Deflation of the tourniquet all fingers immediately pink.  He was taken to the recovery room for observation in satisfactory condition.  He will be discharged home to return to the hand center of [facility] in 1 week Tylenol ibuprofen for pain with Ultram for breakthrough.   Cindee Salt, MD Electronically signed, 10/27/20

## 2020-10-27 NOTE — Discharge Instructions (Addendum)

## 2020-10-27 NOTE — Brief Op Note (Signed)
10/27/2020  9:47 AM  PATIENT:  Jonathan Harper  40 y.o. male  PRE-OPERATIVE DIAGNOSIS:  RIGHT CARPAL TUNNEL SYNDRROME  POST-OPERATIVE DIAGNOSIS:  RIGHT CARPAL TUNNEL SYNDRROME  PROCEDURE:  Procedure(s) with comments: CARPAL TUNNEL RELEASE (Right) - FOREARM BLOCK  SURGEON:  Surgeon(s) and Role:    * Cindee Salt, MD - Primary  PHYSICIAN ASSISTANT:   ASSISTANTS: none   ANESTHESIA:   local, regional and IV sedation  EBL: 32ml BLOOD ADMINISTERED:none  DRAINS: none   LOCAL MEDICATIONS USED:  BUPIVICAINE   SPECIMEN:  No Specimen  DISPOSITION OF SPECIMEN:  N/A  COUNTS:  YES  TOURNIQUET:   Total Tourniquet Time Documented: Forearm (Right) - 14 minutes Total: Forearm (Right) - 14 minutes   DICTATION: .Dragon Dictation  PLAN OF CARE: Discharge to home after PACU  PATIENT DISPOSITION:  PACU - hemodynamically stable.

## 2020-10-27 NOTE — Anesthesia Preprocedure Evaluation (Signed)
Anesthesia Evaluation  Patient identified by MRN, date of birth, ID band Patient awake    Reviewed: Allergy & Precautions, H&P , NPO status , Patient's Chart, lab work & pertinent test results  Airway Mallampati: II  TM Distance: >3 FB Neck ROM: Full    Dental no notable dental hx.    Pulmonary sleep apnea , former smoker,    Pulmonary exam normal breath sounds clear to auscultation       Cardiovascular negative cardio ROS Normal cardiovascular exam Rhythm:Regular Rate:Normal     Neuro/Psych Depression negative neurological ROS  negative psych ROS   GI/Hepatic Neg liver ROS, GERD  ,  Endo/Other  Morbid obesity  Renal/GU negative Renal ROS  negative genitourinary   Musculoskeletal negative musculoskeletal ROS (+)   Abdominal (+) + obese,   Peds negative pediatric ROS (+)  Hematology negative hematology ROS (+)   Anesthesia Other Findings   Reproductive/Obstetrics negative OB ROS                             Anesthesia Physical Anesthesia Plan  ASA: III  Anesthesia Plan: Bier Block and Bier Block-LIDOCAINE ONLY   Post-op Pain Management:    Induction: Intravenous  PONV Risk Score and Plan: 1 and Ondansetron and Treatment may vary due to age or medical condition  Airway Management Planned: Simple Face Mask  Additional Equipment:   Intra-op Plan:   Post-operative Plan:   Informed Consent: I have reviewed the patients History and Physical, chart, labs and discussed the procedure including the risks, benefits and alternatives for the proposed anesthesia with the patient or authorized representative who has indicated his/her understanding and acceptance.     Dental advisory given  Plan Discussed with: CRNA  Anesthesia Plan Comments:         Anesthesia Quick Evaluation

## 2020-10-27 NOTE — Anesthesia Postprocedure Evaluation (Signed)
Anesthesia Post Note  Patient: Jonathan Harper  Procedure(s) Performed: CARPAL TUNNEL RELEASE (Right Wrist)     Patient location during evaluation: PACU Anesthesia Type: Bier Block Level of consciousness: awake and alert Pain management: pain level controlled Vital Signs Assessment: post-procedure vital signs reviewed and stable Respiratory status: spontaneous breathing, nonlabored ventilation and respiratory function stable Cardiovascular status: blood pressure returned to baseline and stable Postop Assessment: no apparent nausea or vomiting Anesthetic complications: no   No complications documented.  Last Vitals:  Vitals:   10/27/20 1000 10/27/20 1021  BP: (!) 130/94 (!) 128/92  Pulse: 67 67  Resp: 19 20  Temp:  36.5 C  SpO2: 98% 99%    Last Pain:  Vitals:   10/27/20 1021  TempSrc: Oral  PainSc: 0-No pain                 Lowella Curb

## 2020-10-27 NOTE — H&P (Signed)
Jonathan Harper is an 40 y.o. male.   Chief Complaint:numbness right hand HPI: Jonathan Harper is a 40 year old right-hand-dominant male comes in complaining of numbness and tingling right greater than left. This been going on for 2 years worse over the past 6 months he works on a [employer]. He states using phone seems to cause him to get numb he is not awakened at night. He has not had any treatment or tried anything complains of numbness and tingling to the whole hand. He has no history of injury to his hand no history of injury to his neck. He has had no car accidents. He complains of occasional dull pain. He has no history of diabetes thyroid problems arthritis or gout. Family history is positive for thyroid problems and arthritis. He has had his nerve conductions done by Dr. Allena Katz revealing bilateral carpal tunnel syndrome with a sensory delay of 5 3 on his right and left of 3.9 his motor response is a delay of 5.9 on his right side.     Past Medical History:  Diagnosis Date  . COVID-19   . Depression   . History of nephrolithiasis   . Pre-diabetes   . Sleep apnea     Past Surgical History:  Procedure Laterality Date  . CHOLECYSTECTOMY    . FOOT SURGERY    . heel spur removal      Family History  Problem Relation Age of Onset  . Hypertension Other   . Cancer Other   . Arthritis Mother   . Arthritis Father    Social History:  reports that he quit smoking about 3 years ago. His smoking use included cigars. He has never used smokeless tobacco. He reports current alcohol use. He reports that he does not use drugs.  Allergies: No Known Allergies  No medications prior to admission.    No results found for this or any previous visit (from the past 48 hour(s)).  No results found.   Pertinent items are noted in HPI.  Height 6' (1.829 m), weight 131.5 kg.  General appearance: alert, cooperative and appears stated age Head: Normocephalic, without obvious abnormality Neck: no JVD Resp:  clear to auscultation bilaterally Cardio: regular rate and rhythm, S1, S2 normal, no murmur, click, rub or gallop GI: soft, non-tender; bowel sounds normal; no masses,  no organomegaly Extremities: numbness right hand Pulses: 2+ and symmetric Skin: Skin color, texture, turgor normal. No rashes or lesions Neurologic: Grossly normal Incision/Wound: na  Assessment/Plan Assessment:  1. Bilateral hand numbness    Plan: We have discussed various treatment alternatives including anti-inflammatory splinting injection or surgical release. He would like to proceed with surgical intervention. Preperi-and postoperative course been discussed along with risk complications. He is aware there is no guarantee to the surgery the possibility of infection recurrence injury to arteries nerves tendons complete relief symptoms dystrophy. He would like to proceed and is scheduled for right carpal tunnel release in outpatient under regional anesthesia.     Cindee Salt 10/27/2020, 5:04 AM

## 2020-10-28 ENCOUNTER — Encounter (HOSPITAL_BASED_OUTPATIENT_CLINIC_OR_DEPARTMENT_OTHER): Payer: Self-pay | Admitting: Orthopedic Surgery

## 2021-02-14 IMAGING — DX DG CHEST 1V PORT
1 series · 1 of 1 positions shown · non-contrast
Comparison: 01/26/2016

CLINICAL DATA: Shortness of breath and fever.  [id number] positive.

EXAM:
PORTABLE CHEST 1 VIEW

[chest ap]
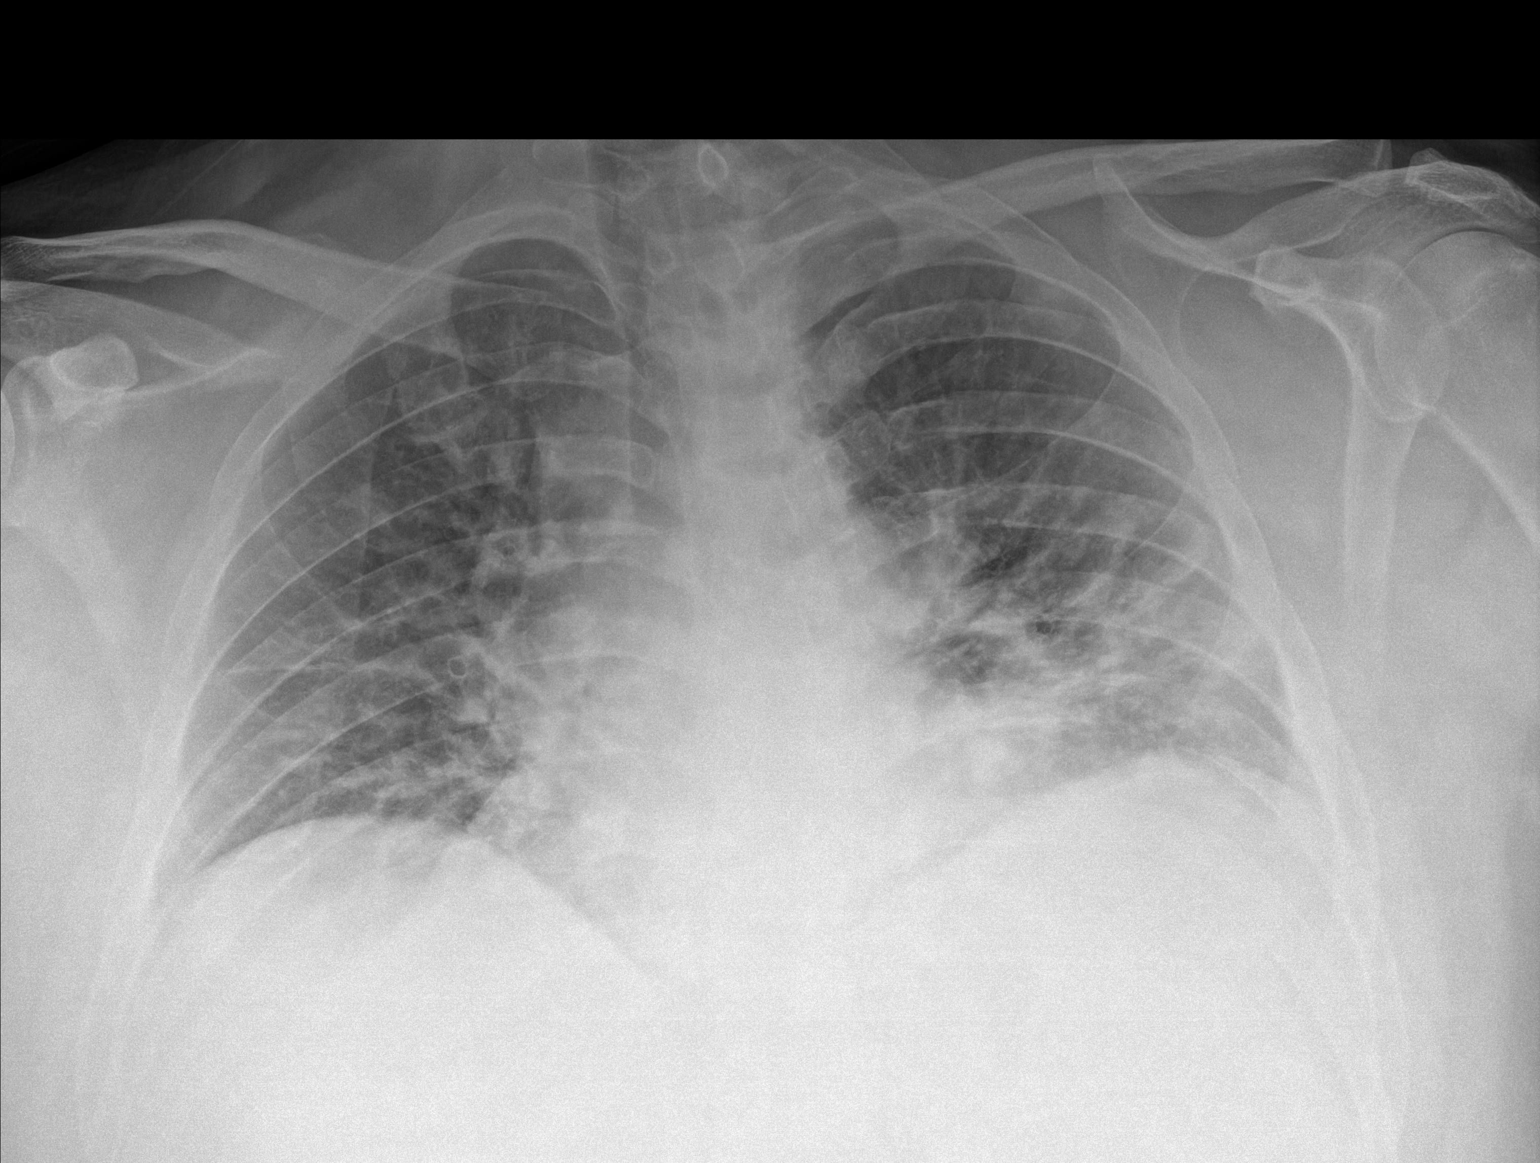

[1 of 1 positions shown; findings below may reference images not displayed]

FINDINGS: Lungs are hypoinflated with mild patchy bilateral opacification left
worse than right suggesting multifocal pneumonia. No effusion.
Cardiomediastinal silhouette and remainder of the exam is unchanged.
IMPRESSION: Patchy opacification over the mid to lower lungs left worse than
right suggesting multifocal pneumonia.

## 2021-02-14 IMAGING — CT CT ANGIO CHEST
2 of 6 series · 18 of 46 positions shown · IV contrast (APPLIED)
Comparison: Chest radiograph dated 12/22/2019

CLINICAL DATA: Shortness of breath, cough, COVID positive. Evaluate
for PE.

EXAM:
CT ANGIOGRAPHY CHEST WITH CONTRAST
TECHNIQUE: Multidetector CT imaging of the chest was performed using the
standard protocol during bolus administration of intravenous
contrast. Multiplanar CT image reconstructions and MIPs were
obtained to evaluate the vascular anatomy.
CONTRAST:  100mL OMNIPAQUE IOHEXOL 350 MG/ML SOLN

[Series 6: thins · axial · 0.85mm/px · z∈[-594,-354]mm · 16 of 263 slices shown]
[im 12/263  lung]
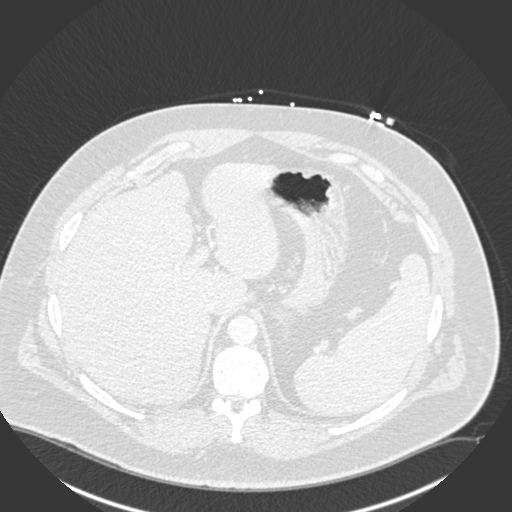
[im 35/263  soft-tissue]
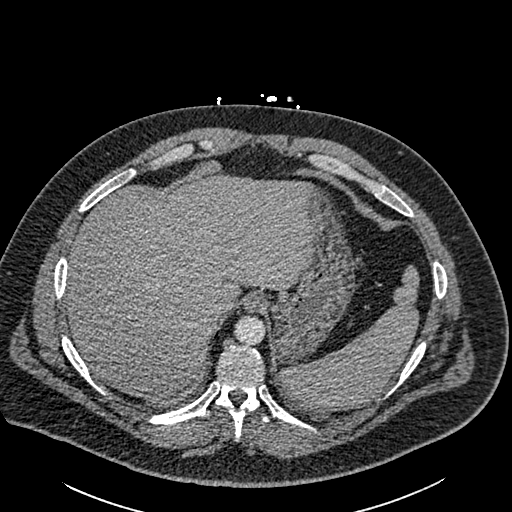
[im 46/263  lung]
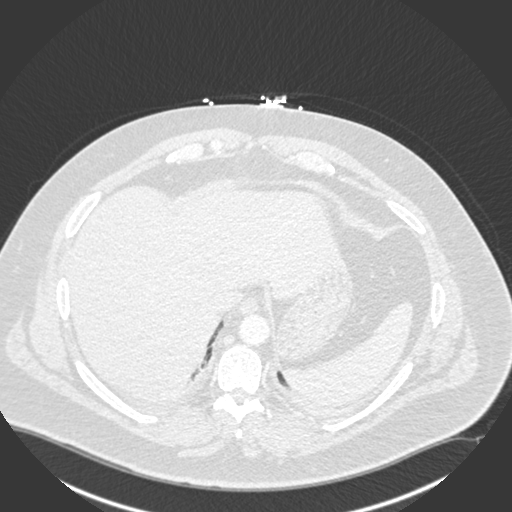
[im 57/263  soft-tissue]
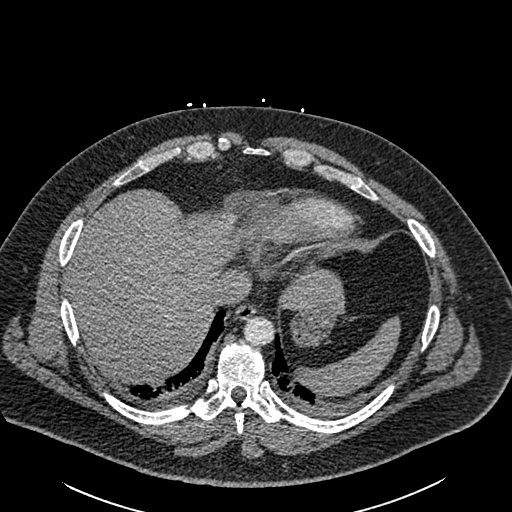
[im 80/263  lung]
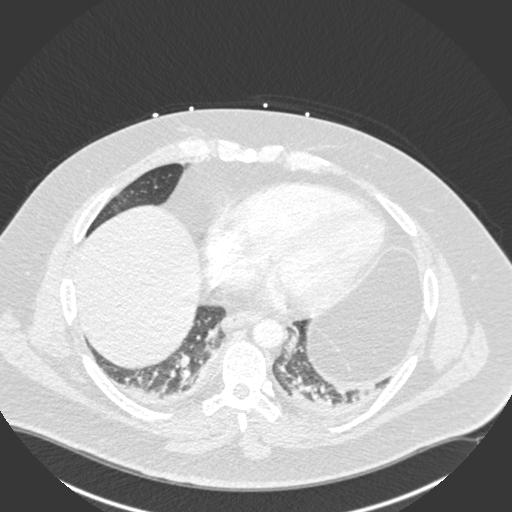
[im 92/263  soft-tissue]
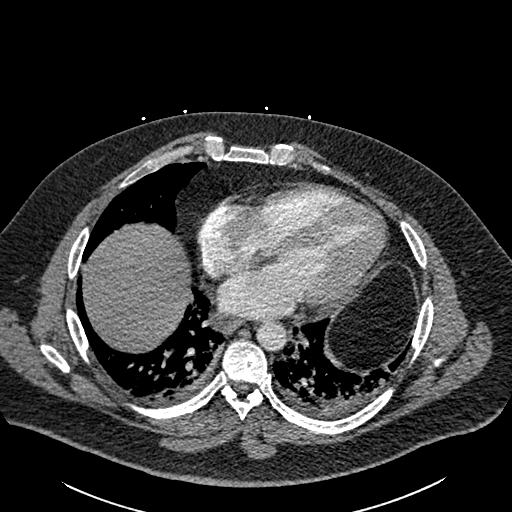
[im 103/263  lung]
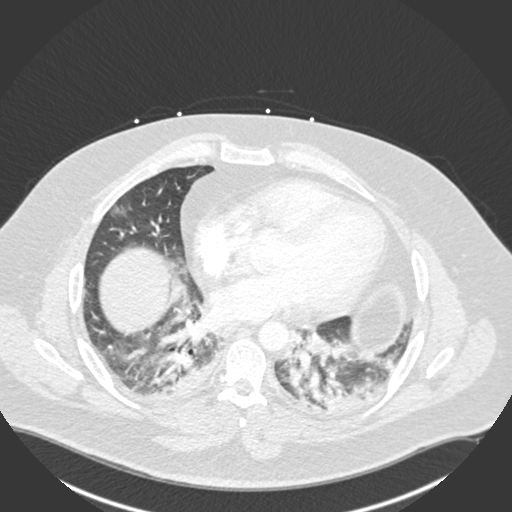
[im 126/263  soft-tissue]
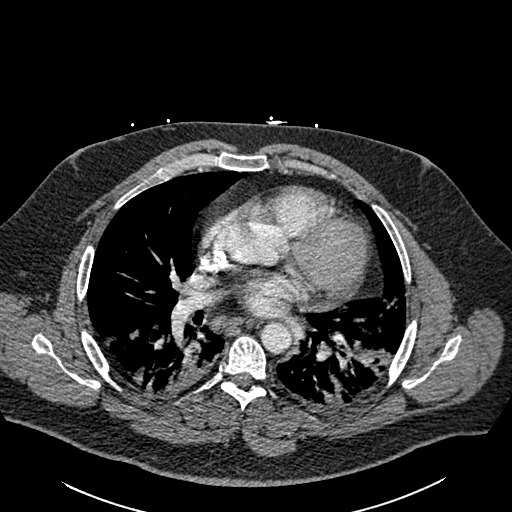
[im 137/263  lung]
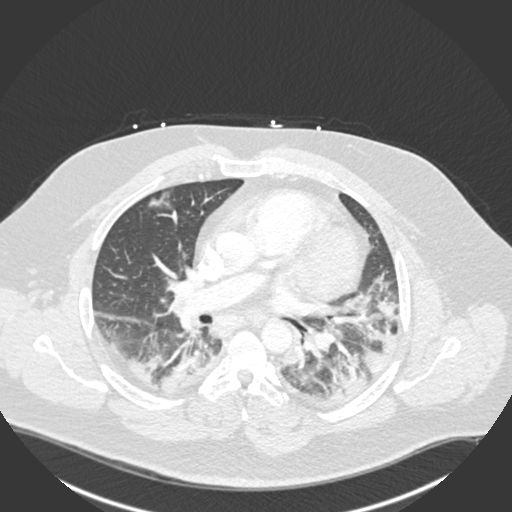
[im 160/263  soft-tissue]
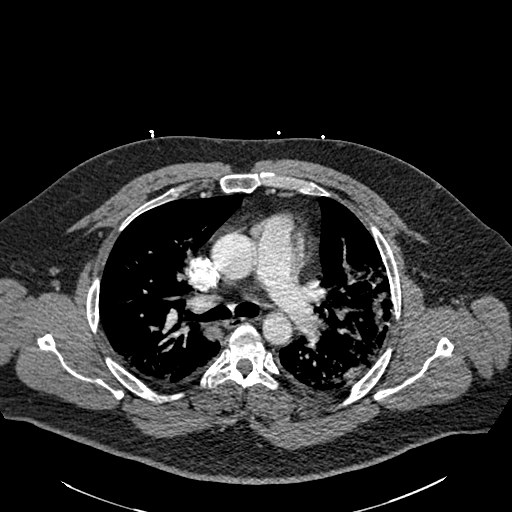
[im 171/263  lung]
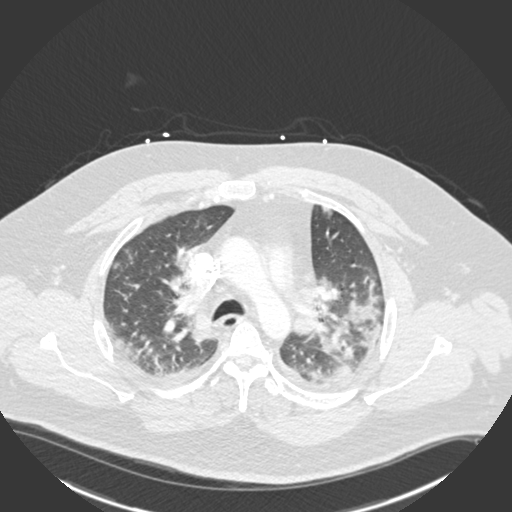
[im 183/263  soft-tissue]
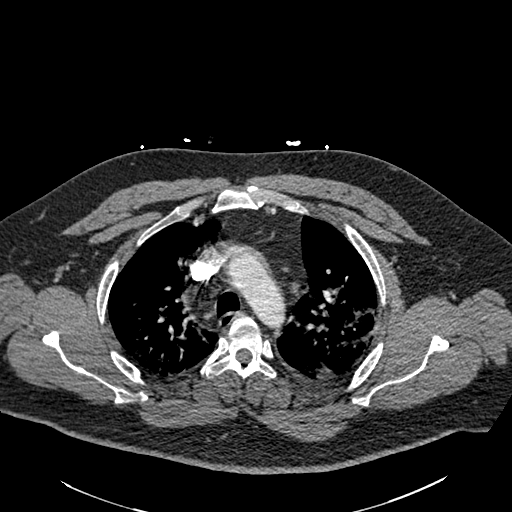
[im 206/263  lung]
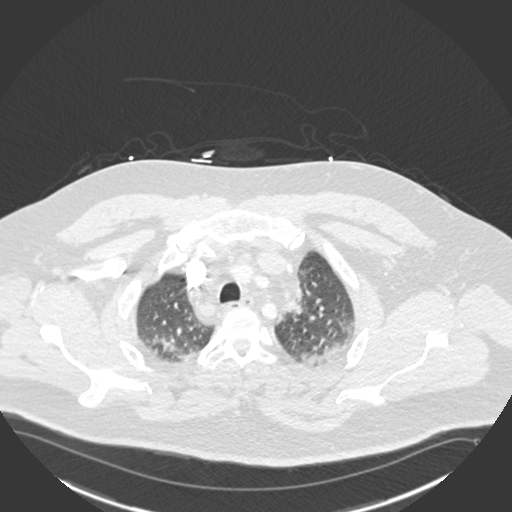
[im 217/263  soft-tissue]
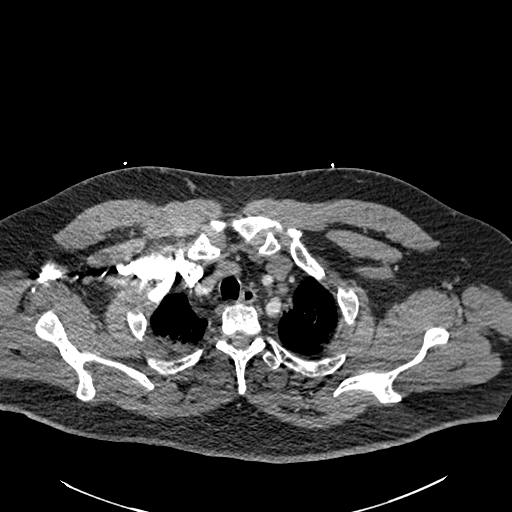
[im 228/263  lung]
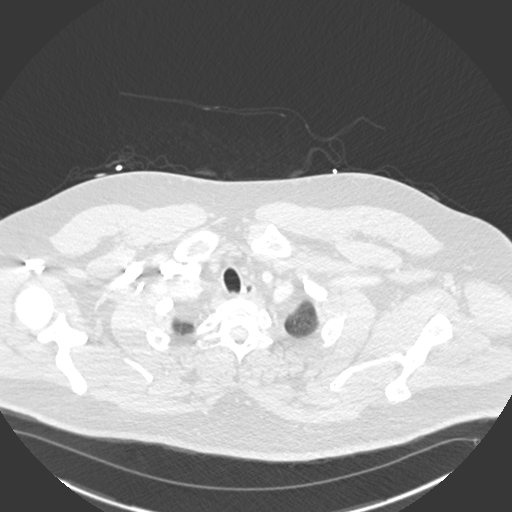
[im 251/263  soft-tissue]
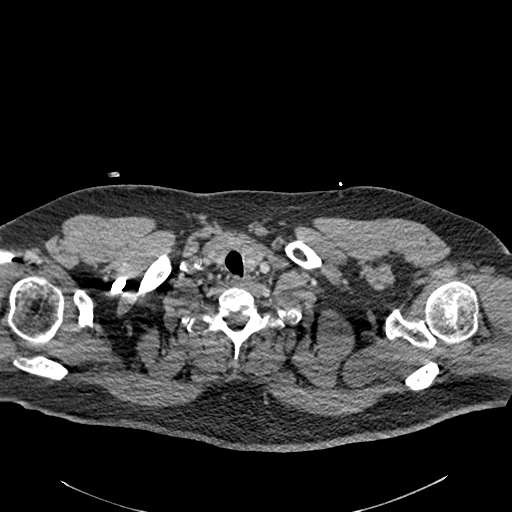

[Series 8: coronal mpr · coronal · 0.51mm/px · 2 of 105 slices shown]
[im 35/105  soft-tissue]
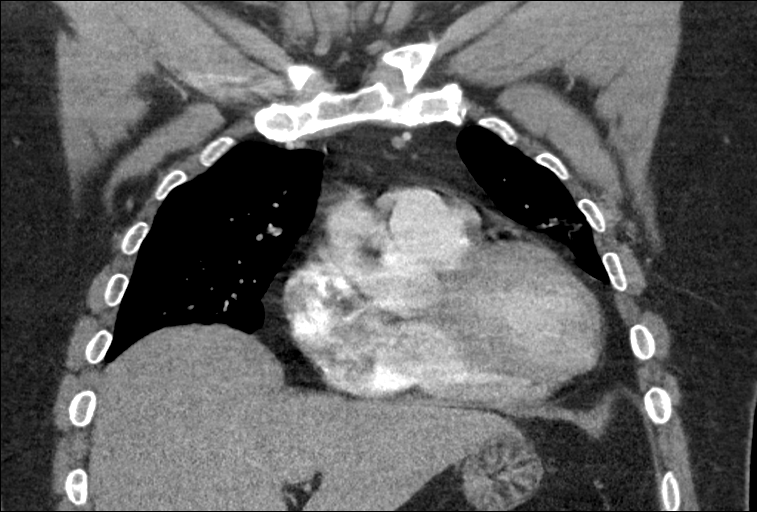
[im 70/105  soft-tissue]
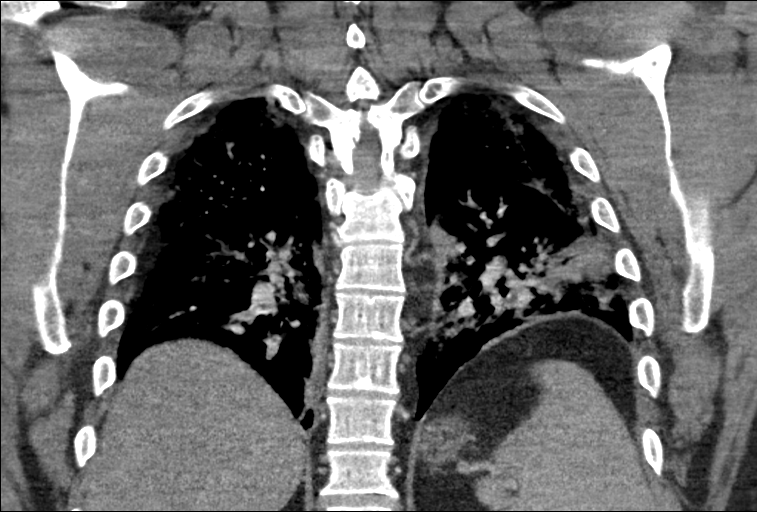

[18 of 46 positions shown; findings below may reference images not displayed]

FINDINGS: Cardiovascular: Suboptimal contrast opacification of the bilateral
pulmonary arteries due to bolus timing and respiratory motion. No
evidence of central pulmonary embolism.

No evidence of thoracic aortic aneurysm or dissection.

The heart is normal in size.  No pericardial effusion.

Mediastinum/Nodes: No suspicious mediastinal lymphadenopathy.

Visualized thyroid is unremarkable.

Lungs/Pleura: Multifocal subpleural patchy opacities in the lungs
bilaterally, lower lobe predominant, likely reflecting multifocal
pneumonia in this patient with known COVID.

Trace bilateral pleural effusions.

No suspicious pulmonary nodules, although evaluation is limited.

No pneumothorax.

Upper Abdomen: Visualized upper abdomen is grossly unremarkable.

Musculoskeletal: Visualized osseous structures are within normal
limits.

Review of the MIP images confirms the above findings.
IMPRESSION: No evidence of central pulmonary embolism.

Multifocal pneumonia in this patient with known COVID. Trace
bilateral pleural effusions.
